# Patient Record
Sex: Male | Born: 1940 | Race: White | Hispanic: No | Marital: Married | Smoking: Never smoker
Health system: Southern US, Community
[De-identification: ages and names within clinical notes are randomized; demographics above are authoritative.]

## PROBLEM LIST (undated history)

## (undated) DIAGNOSIS — I639 Cerebral infarction, unspecified: Secondary | ICD-10-CM

## (undated) DIAGNOSIS — R51 Headache: Secondary | ICD-10-CM

## (undated) DIAGNOSIS — Z9889 Other specified postprocedural states: Secondary | ICD-10-CM

## (undated) DIAGNOSIS — Z87442 Personal history of urinary calculi: Secondary | ICD-10-CM

## (undated) DIAGNOSIS — R112 Nausea with vomiting, unspecified: Secondary | ICD-10-CM

## (undated) DIAGNOSIS — C44319 Basal cell carcinoma of skin of other parts of face: Secondary | ICD-10-CM

## (undated) DIAGNOSIS — I10 Essential (primary) hypertension: Secondary | ICD-10-CM

## (undated) DIAGNOSIS — G459 Transient cerebral ischemic attack, unspecified: Secondary | ICD-10-CM

## (undated) DIAGNOSIS — N400 Enlarged prostate without lower urinary tract symptoms: Secondary | ICD-10-CM

## (undated) DIAGNOSIS — K5730 Diverticulosis of large intestine without perforation or abscess without bleeding: Secondary | ICD-10-CM

## (undated) DIAGNOSIS — M1990 Unspecified osteoarthritis, unspecified site: Secondary | ICD-10-CM

## (undated) DIAGNOSIS — R519 Headache, unspecified: Secondary | ICD-10-CM

## (undated) DIAGNOSIS — N189 Chronic kidney disease, unspecified: Secondary | ICD-10-CM

## (undated) DIAGNOSIS — K4090 Unilateral inguinal hernia, without obstruction or gangrene, not specified as recurrent: Secondary | ICD-10-CM

## (undated) DIAGNOSIS — R200 Anesthesia of skin: Secondary | ICD-10-CM

## (undated) DIAGNOSIS — F419 Anxiety disorder, unspecified: Secondary | ICD-10-CM

## (undated) DIAGNOSIS — G8929 Other chronic pain: Secondary | ICD-10-CM

## (undated) DIAGNOSIS — N2889 Other specified disorders of kidney and ureter: Secondary | ICD-10-CM

## (undated) DIAGNOSIS — M25519 Pain in unspecified shoulder: Secondary | ICD-10-CM

## (undated) DIAGNOSIS — K219 Gastro-esophageal reflux disease without esophagitis: Secondary | ICD-10-CM

## (undated) DIAGNOSIS — R202 Paresthesia of skin: Secondary | ICD-10-CM

## (undated) HISTORY — PX: TONSILLECTOMY: SUR1361

## (undated) HISTORY — PX: HERNIA REPAIR: SHX51

## (undated) HISTORY — PX: EYE SURGERY: SHX253

## (undated) HISTORY — PX: JOINT REPLACEMENT: SHX530

---

## 1978-09-29 HISTORY — PX: APPENDECTOMY: SHX54

## 1999-09-30 HISTORY — PX: OTHER SURGICAL HISTORY: SHX169

## 2012-09-29 HISTORY — PX: JOINT REPLACEMENT: SHX530

## 2015-09-01 ENCOUNTER — Emergency Department (HOSPITAL_COMMUNITY): Payer: Medicare Other

## 2015-09-01 ENCOUNTER — Emergency Department (HOSPITAL_COMMUNITY)
Admission: EM | Admit: 2015-09-01 | Discharge: 2015-09-01 | Disposition: A | Discharge status: Home / Self Care | Payer: Medicare Other | Attending: Emergency Medicine | Admitting: Emergency Medicine

## 2015-09-01 ENCOUNTER — Encounter (HOSPITAL_COMMUNITY): Payer: Self-pay | Admitting: Emergency Medicine

## 2015-09-01 DIAGNOSIS — Z9889 Other specified postprocedural states: Secondary | ICD-10-CM | POA: Diagnosis not present

## 2015-09-01 DIAGNOSIS — I10 Essential (primary) hypertension: Secondary | ICD-10-CM | POA: Insufficient documentation

## 2015-09-01 DIAGNOSIS — M25562 Pain in left knee: Secondary | ICD-10-CM | POA: Diagnosis not present

## 2015-09-01 HISTORY — DX: Unspecified osteoarthritis, unspecified site: M19.90

## 2015-09-01 HISTORY — DX: Essential (primary) hypertension: I10

## 2015-09-01 MED ORDER — HYDROCODONE-ACETAMINOPHEN 5-325 MG PO TABS: 1.0000 | ORAL_TABLET | Freq: Four times a day (QID) | ORAL | Status: DC | PRN

## 2015-09-01 NOTE — ED Provider Notes (Addendum)
CSN: [id number]     Arrival date & time 09/01/15  1045 History   First MD Initiated Contact with Patient 09/01/15 1246     Chief Complaint  Patient presents with  . Knee Pain     (Consider location/radiation/quality/duration/timing/severity/associated sxs/prior Treatment) HPI Comments: Patient is a 74 year old male with history of prior knee surgery. He presents for evaluation of left knee pain. He states that he was sleeping last night and moved his knee awkwardly. Since that time he is experiencing severe pain. He does report being on his feet more raking leaves and doing yardwork, but denies any specific injury.  Patient is a 74 y.o. male presenting with knee pain. The history is provided by the patient.  Knee Pain Location:  Knee Time since incident:  12 hours Knee location:  L knee Pain details:    Quality:  Sharp   Radiates to:  Does not radiate   Severity:  Severe   Onset quality:  Sudden   Duration:  12 hours   Timing:  Constant   Progression:  Unchanged Chronicity:  New Relieved by:  Nothing Worsened by:  Extension and bearing weight Ineffective treatments:  None tried   Past Medical History  Diagnosis Date  . Hypertension   . Arthritis    Past Surgical History  Procedure Laterality Date  . Joint replacement     No family history on file. Social History  Substance Use Topics  . Smoking status: Never Smoker   . Smokeless tobacco: None  . Alcohol Use: No    Review of Systems  All other systems reviewed and are negative.     Allergies  Review of patient's allergies indicates not on file.  Home Medications   Prior to Admission medications   Not on File   BP 115/71 mmHg  Pulse 67  Temp(Src) 97.9 F (36.6 C) (Oral)  Resp 16  Ht 5\' 9"  (1.753 m)  Wt 200 lb (90.719 kg)  BMI 29.52 kg/m2  SpO2 99% Physical Exam  Constitutional: He is oriented to person, place, and time. He appears well-developed and well-nourished. No distress.  HENT:  Head:  Normocephalic and atraumatic.  Neck: Normal range of motion. Neck supple.  Musculoskeletal: Normal range of motion.  The left knee appears grossly normal. The prior surgery incisions are noted and are well-healed. There is tenderness over the anterior aspect of the knee just below the patella and in the posterior aspect of the knee. There is pain with range of motion. Exam is limited due to the degree of discomfort.  Neurological: He is alert and oriented to person, place, and time.  Skin: Skin is warm and dry. He is not diaphoretic.  Nursing note and vitals reviewed.   ED Course  Procedures (including critical care time) Labs Review Labs Reviewed - No data to display  Imaging Review No results found. I have personally reviewed and evaluated these images and lab results as part of my medical decision-making.   EKG Interpretation None      MDM   Final diagnoses:  None    X-rays reveal degenerative changes, however no other acute pathology. He has pain with range of motion of the knee, however there is no effusion, warmth, or redness. I highly doubt a septic joint. There also appears to be no other acute pathology. He will be treated with pain medication, and knee immobilizer, crutches, and follow-up with orthopedics if not improving.    Veryl Speak, MD 09/01/15 [place],  MD 09/01/15 1500

## 2015-09-01 NOTE — Discharge Instructions (Signed)
Where knee immobilizer as applied.  Use crutches for ambulation and weightbearing as tolerated.  Hydrocodone as prescribed as needed for pain.  Follow-up with orthopedics if not improving in the next several days. The contact information for Dr. Marlou Sa has been provided in this discharge summary for you to arrange a follow-up appointment.   Knee Pain Knee pain is a very common symptom and can have many causes. Knee pain often goes away when you follow your health care provider's instructions for relieving pain and discomfort at home. However, knee pain can develop into a condition that needs treatment. Some conditions may include:  Arthritis caused by wear and tear (osteoarthritis).  Arthritis caused by swelling and irritation (rheumatoid arthritis or gout).  A cyst or growth in your knee.  An infection in your knee joint.  An injury that will not heal.  Damage, swelling, or irritation of the tissues that support your knee (torn ligaments or tendinitis). If your knee pain continues, additional tests may be ordered to diagnose your condition. Tests may include X-rays or other imaging studies of your knee. You may also need to have fluid removed from your knee. Treatment for ongoing knee pain depends on the cause, but treatment may include:  Medicines to relieve pain or swelling.  Steroid injections in your knee.  Physical therapy.  Surgery. HOME CARE INSTRUCTIONS  Take medicines only as directed by your health care provider.  Rest your knee and keep it raised (elevated) while you are resting.  Do not do things that cause or worsen pain.  Avoid high-impact activities or exercises, such as running, jumping rope, or doing jumping jacks.  Apply ice to the knee area:  Put ice in a plastic bag.  Place a towel between your skin and the bag.  Leave the ice on for 20 minutes, 2-3 times a day.  Ask your health care provider if you should wear an elastic knee support.  Keep a  pillow under your knee when you sleep.  Lose weight if you are overweight. Extra weight can put pressure on your knee.  Do not use any tobacco products, including cigarettes, chewing tobacco, or electronic cigarettes. If you need help quitting, ask your health care provider. Smoking may slow the healing of any bone and joint problems that you may have. SEEK MEDICAL CARE IF:  Your knee pain continues, changes, or gets worse.  You have a fever along with knee pain.  Your knee buckles or locks up.  Your knee becomes more swollen. SEEK IMMEDIATE MEDICAL CARE IF:   Your knee joint feels hot to the touch.  You have chest pain or trouble breathing.   This information is not intended to replace advice given to you by your health care provider. Make sure you discuss any questions you have with your health care provider.   Document Released: 07/13/2007 Document Revised: 10/06/2014 Document Reviewed: 05/01/2014 Elsevier Interactive Patient Education [employer].

## 2015-09-01 NOTE — ED Notes (Signed)
Ptl. Stated, I tried to get out of bed this morning and my left knee just would not work.  I've had knee surgery and a left hip replacement.  i usually have arthritis but able to walk.

## 2015-12-10 DIAGNOSIS — M25511 Pain in right shoulder: Secondary | ICD-10-CM | POA: Diagnosis not present

## 2015-12-10 DIAGNOSIS — R600 Localized edema: Secondary | ICD-10-CM | POA: Diagnosis not present

## 2015-12-10 DIAGNOSIS — Z974 Presence of external hearing-aid: Secondary | ICD-10-CM | POA: Diagnosis not present

## 2015-12-10 DIAGNOSIS — E7800 Pure hypercholesterolemia, unspecified: Secondary | ICD-10-CM | POA: Diagnosis not present

## 2015-12-10 DIAGNOSIS — R112 Nausea with vomiting, unspecified: Secondary | ICD-10-CM | POA: Diagnosis not present

## 2015-12-10 DIAGNOSIS — M25512 Pain in left shoulder: Secondary | ICD-10-CM | POA: Diagnosis not present

## 2015-12-10 DIAGNOSIS — M170 Bilateral primary osteoarthritis of knee: Secondary | ICD-10-CM | POA: Diagnosis not present

## 2015-12-10 DIAGNOSIS — I10 Essential (primary) hypertension: Secondary | ICD-10-CM | POA: Diagnosis not present

## 2015-12-10 DIAGNOSIS — Z96642 Presence of left artificial hip joint: Secondary | ICD-10-CM | POA: Diagnosis not present

## 2015-12-14 DIAGNOSIS — R600 Localized edema: Secondary | ICD-10-CM | POA: Diagnosis not present

## 2015-12-14 DIAGNOSIS — E7800 Pure hypercholesterolemia, unspecified: Secondary | ICD-10-CM | POA: Diagnosis not present

## 2015-12-14 DIAGNOSIS — M25511 Pain in right shoulder: Secondary | ICD-10-CM | POA: Diagnosis not present

## 2015-12-14 DIAGNOSIS — Z974 Presence of external hearing-aid: Secondary | ICD-10-CM | POA: Diagnosis not present

## 2015-12-14 DIAGNOSIS — Z96642 Presence of left artificial hip joint: Secondary | ICD-10-CM | POA: Diagnosis not present

## 2015-12-14 DIAGNOSIS — M170 Bilateral primary osteoarthritis of knee: Secondary | ICD-10-CM | POA: Diagnosis not present

## 2015-12-14 DIAGNOSIS — M25512 Pain in left shoulder: Secondary | ICD-10-CM | POA: Diagnosis not present

## 2015-12-14 DIAGNOSIS — I10 Essential (primary) hypertension: Secondary | ICD-10-CM | POA: Diagnosis not present

## 2016-02-11 DIAGNOSIS — L404 Guttate psoriasis: Secondary | ICD-10-CM | POA: Diagnosis not present

## 2016-02-11 DIAGNOSIS — M25512 Pain in left shoulder: Secondary | ICD-10-CM | POA: Diagnosis not present

## 2016-02-11 DIAGNOSIS — Z974 Presence of external hearing-aid: Secondary | ICD-10-CM | POA: Diagnosis not present

## 2016-02-11 DIAGNOSIS — I10 Essential (primary) hypertension: Secondary | ICD-10-CM | POA: Diagnosis not present

## 2016-02-11 DIAGNOSIS — E7800 Pure hypercholesterolemia, unspecified: Secondary | ICD-10-CM | POA: Diagnosis not present

## 2016-02-11 DIAGNOSIS — M25511 Pain in right shoulder: Secondary | ICD-10-CM | POA: Diagnosis not present

## 2016-02-11 DIAGNOSIS — L821 Other seborrheic keratosis: Secondary | ICD-10-CM | POA: Diagnosis not present

## 2016-02-11 DIAGNOSIS — M170 Bilateral primary osteoarthritis of knee: Secondary | ICD-10-CM | POA: Diagnosis not present

## 2016-06-16 DIAGNOSIS — L400 Psoriasis vulgaris: Secondary | ICD-10-CM | POA: Diagnosis not present

## 2016-06-16 DIAGNOSIS — D225 Melanocytic nevi of trunk: Secondary | ICD-10-CM | POA: Diagnosis not present

## 2016-06-16 DIAGNOSIS — L820 Inflamed seborrheic keratosis: Secondary | ICD-10-CM | POA: Diagnosis not present

## 2016-06-16 DIAGNOSIS — L814 Other melanin hyperpigmentation: Secondary | ICD-10-CM | POA: Diagnosis not present

## 2016-06-16 DIAGNOSIS — D1721 Benign lipomatous neoplasm of skin and subcutaneous tissue of right arm: Secondary | ICD-10-CM | POA: Diagnosis not present

## 2016-06-16 DIAGNOSIS — L821 Other seborrheic keratosis: Secondary | ICD-10-CM | POA: Diagnosis not present

## 2016-06-16 DIAGNOSIS — D1801 Hemangioma of skin and subcutaneous tissue: Secondary | ICD-10-CM | POA: Diagnosis not present

## 2016-07-03 DIAGNOSIS — E7800 Pure hypercholesterolemia, unspecified: Secondary | ICD-10-CM | POA: Diagnosis not present

## 2016-07-03 DIAGNOSIS — L404 Guttate psoriasis: Secondary | ICD-10-CM | POA: Diagnosis not present

## 2016-07-03 DIAGNOSIS — J45909 Unspecified asthma, uncomplicated: Secondary | ICD-10-CM | POA: Diagnosis not present

## 2016-07-03 DIAGNOSIS — I10 Essential (primary) hypertension: Secondary | ICD-10-CM | POA: Diagnosis not present

## 2016-07-03 DIAGNOSIS — G8929 Other chronic pain: Secondary | ICD-10-CM | POA: Diagnosis not present

## 2016-07-03 DIAGNOSIS — Z23 Encounter for immunization: Secondary | ICD-10-CM | POA: Diagnosis not present

## 2016-07-08 DIAGNOSIS — R05 Cough: Secondary | ICD-10-CM | POA: Diagnosis not present

## 2016-10-03 DIAGNOSIS — R05 Cough: Secondary | ICD-10-CM | POA: Diagnosis not present

## 2016-11-04 DIAGNOSIS — E7800 Pure hypercholesterolemia, unspecified: Secondary | ICD-10-CM | POA: Diagnosis not present

## 2016-11-04 DIAGNOSIS — N529 Male erectile dysfunction, unspecified: Secondary | ICD-10-CM | POA: Diagnosis not present

## 2016-11-04 DIAGNOSIS — F5101 Primary insomnia: Secondary | ICD-10-CM | POA: Diagnosis not present

## 2016-11-04 DIAGNOSIS — J45909 Unspecified asthma, uncomplicated: Secondary | ICD-10-CM | POA: Diagnosis not present

## 2016-11-04 DIAGNOSIS — L404 Guttate psoriasis: Secondary | ICD-10-CM | POA: Diagnosis not present

## 2016-11-04 DIAGNOSIS — G8929 Other chronic pain: Secondary | ICD-10-CM | POA: Diagnosis not present

## 2016-11-04 DIAGNOSIS — I10 Essential (primary) hypertension: Secondary | ICD-10-CM | POA: Diagnosis not present

## 2017-05-12 DIAGNOSIS — F5101 Primary insomnia: Secondary | ICD-10-CM | POA: Diagnosis not present

## 2017-05-12 DIAGNOSIS — I10 Essential (primary) hypertension: Secondary | ICD-10-CM | POA: Diagnosis not present

## 2017-05-12 DIAGNOSIS — R1013 Epigastric pain: Secondary | ICD-10-CM | POA: Diagnosis not present

## 2017-05-12 DIAGNOSIS — L404 Guttate psoriasis: Secondary | ICD-10-CM | POA: Diagnosis not present

## 2017-05-12 DIAGNOSIS — N529 Male erectile dysfunction, unspecified: Secondary | ICD-10-CM | POA: Diagnosis not present

## 2017-05-12 DIAGNOSIS — J45909 Unspecified asthma, uncomplicated: Secondary | ICD-10-CM | POA: Diagnosis not present

## 2017-05-12 DIAGNOSIS — E7800 Pure hypercholesterolemia, unspecified: Secondary | ICD-10-CM | POA: Diagnosis not present

## 2017-05-12 DIAGNOSIS — G8929 Other chronic pain: Secondary | ICD-10-CM | POA: Diagnosis not present

## 2017-05-13 DIAGNOSIS — K219 Gastro-esophageal reflux disease without esophagitis: Secondary | ICD-10-CM | POA: Diagnosis not present

## 2017-05-13 DIAGNOSIS — R1013 Epigastric pain: Secondary | ICD-10-CM | POA: Diagnosis not present

## 2017-05-13 DIAGNOSIS — R634 Abnormal weight loss: Secondary | ICD-10-CM | POA: Diagnosis not present

## 2017-05-30 DIAGNOSIS — K5730 Diverticulosis of large intestine without perforation or abscess without bleeding: Secondary | ICD-10-CM

## 2017-05-30 DIAGNOSIS — K4090 Unilateral inguinal hernia, without obstruction or gangrene, not specified as recurrent: Secondary | ICD-10-CM

## 2017-05-30 HISTORY — DX: Diverticulosis of large intestine without perforation or abscess without bleeding: K57.30

## 2017-05-30 HISTORY — DX: Unilateral inguinal hernia, without obstruction or gangrene, not specified as recurrent: K40.90

## 2017-06-16 ENCOUNTER — Emergency Department (HOSPITAL_COMMUNITY): Payer: PPO

## 2017-06-16 ENCOUNTER — Emergency Department (HOSPITAL_COMMUNITY)
Admission: EM | Admit: 2017-06-16 | Discharge: 2017-06-16 | Disposition: A | Discharge status: Home / Self Care | Payer: PPO | Attending: Emergency Medicine | Admitting: Emergency Medicine

## 2017-06-16 ENCOUNTER — Encounter (HOSPITAL_COMMUNITY): Payer: Self-pay | Admitting: *Deleted

## 2017-06-16 DIAGNOSIS — I6501 Occlusion and stenosis of right vertebral artery: Secondary | ICD-10-CM | POA: Diagnosis not present

## 2017-06-16 DIAGNOSIS — R42 Dizziness and giddiness: Secondary | ICD-10-CM | POA: Diagnosis not present

## 2017-06-16 DIAGNOSIS — I10 Essential (primary) hypertension: Secondary | ICD-10-CM | POA: Insufficient documentation

## 2017-06-16 DIAGNOSIS — H532 Diplopia: Secondary | ICD-10-CM | POA: Insufficient documentation

## 2017-06-16 DIAGNOSIS — R110 Nausea: Secondary | ICD-10-CM | POA: Insufficient documentation

## 2017-06-16 DIAGNOSIS — J018 Other acute sinusitis: Secondary | ICD-10-CM | POA: Insufficient documentation

## 2017-06-16 DIAGNOSIS — Z79899 Other long term (current) drug therapy: Secondary | ICD-10-CM | POA: Diagnosis not present

## 2017-06-16 DIAGNOSIS — R51 Headache: Secondary | ICD-10-CM | POA: Diagnosis not present

## 2017-06-16 LAB — CBC
HEMATOCRIT: 41.2 % (ref 39.0–52.0)
Hemoglobin: 13.7 g/dL (ref 13.0–17.0)
MCH: 29.5 pg (ref 26.0–34.0)
MCHC: 33.3 g/dL (ref 30.0–36.0)
MCV: 88.6 fL (ref 78.0–100.0)
PLATELETS: 246 10*3/uL (ref 150–400)
RBC: 4.65 MIL/uL (ref 4.22–5.81)
RDW: 13 % (ref 11.5–15.5)
WBC: 6.7 10*3/uL (ref 4.0–10.5)

## 2017-06-16 LAB — COMPREHENSIVE METABOLIC PANEL
ALK PHOS: 44 U/L (ref 38–126)
ALT: 20 U/L (ref 17–63)
AST: 23 U/L (ref 15–41)
Albumin: 4.2 g/dL (ref 3.5–5.0)
Anion gap: 11 (ref 5–15)
BUN: 18 mg/dL (ref 6–20)
CALCIUM: 9.6 mg/dL (ref 8.9–10.3)
CO2: 27 mmol/L (ref 22–32)
CREATININE: 1.31 mg/dL — AB (ref 0.61–1.24)
Chloride: 102 mmol/L (ref 101–111)
GFR calc non Af Amer: 51 mL/min — ABNORMAL LOW (ref >60)
GFR, EST AFRICAN AMERICAN: 59 mL/min — AB (ref >60)
GLUCOSE: 103 mg/dL — AB (ref 65–99)
Potassium: 3.8 mmol/L (ref 3.5–5.1)
SODIUM: 140 mmol/L (ref 135–145)
Total Bilirubin: 0.5 mg/dL (ref 0.3–1.2)
Total Protein: 7.3 g/dL (ref 6.5–8.1)

## 2017-06-16 LAB — DIFFERENTIAL
Basophils Absolute: 0 10*3/uL (ref 0.0–0.1)
Basophils Relative: 0 %
Eosinophils Absolute: 0.2 10*3/uL (ref 0.0–0.7)
Eosinophils Relative: 3 %
LYMPHS PCT: 32 %
Lymphs Abs: 2.1 10*3/uL (ref 0.7–4.0)
MONO ABS: 0.7 10*3/uL (ref 0.1–1.0)
Monocytes Relative: 11 %
NEUTROS ABS: 3.6 10*3/uL (ref 1.7–7.7)
Neutrophils Relative %: 54 %

## 2017-06-16 LAB — I-STAT CHEM 8, ED
BUN: 22 mg/dL — AB (ref 6–20)
CALCIUM ION: 1.13 mmol/L — AB (ref 1.15–1.40)
CREATININE: 1.2 mg/dL (ref 0.61–1.24)
Chloride: 102 mmol/L (ref 101–111)
GLUCOSE: 104 mg/dL — AB (ref 65–99)
HCT: 43 % (ref 39.0–52.0)
Hemoglobin: 14.6 g/dL (ref 13.0–17.0)
Potassium: 3.8 mmol/L (ref 3.5–5.1)
Sodium: 142 mmol/L (ref 135–145)
TCO2: 29 mmol/L (ref 22–32)

## 2017-06-16 LAB — PROTIME-INR
INR: 0.91
PROTHROMBIN TIME: 12.2 s (ref 11.4–15.2)

## 2017-06-16 LAB — APTT: aPTT: 31 seconds (ref 24–36)

## 2017-06-16 LAB — I-STAT TROPONIN, ED: Troponin i, poc: 0 ng/mL (ref 0.00–0.08)

## 2017-06-16 MED ORDER — METOCLOPRAMIDE HCL 5 MG/ML IJ SOLN
10.0000 mg | Freq: Once | INTRAMUSCULAR | Status: AC
  Administered 2017-06-16: 10 mg via INTRAVENOUS
  Filled 2017-06-16: qty 2

## 2017-06-16 MED ORDER — LORAZEPAM 2 MG/ML IJ SOLN
1.0000 mg | Freq: Once | INTRAMUSCULAR | Status: AC | PRN
  Administered 2017-06-16: 1 mg via INTRAVENOUS
  Filled 2017-06-16: qty 1

## 2017-06-16 NOTE — ED Notes (Signed)
Patient transported to MRI 

## 2017-06-16 NOTE — ED Provider Notes (Signed)
[facility] DEPT Provider Note   CSN: [id number] Arrival date & time: 06/16/17  1640     History   Chief Complaint Chief Complaint  Patient presents with  . Headache  . Stroke Symptoms    HPI Bernard Young is a 76 y.o. male.  HPI 76 year old male who presents with headache and double vision. He has a history of hypertension. 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 Reports headache for the past 13 days that has been constant, intermittent in severity over the frontal aspect of his forehead. Today at about 9 AM to 10 AM he developed double vision. He has felt nauseated but denies any vomiting. Has not had speech changes, focal numbness or weakness. He has noted some difficulty walking, but no dizziness.Has not tried any medications for his symptoms. No other alleviating or aggravating factors.   Past Medical History:  Diagnosis Date  . Arthritis   . Hypertension     There are no active problems to display for this patient.   Past Surgical History:  Procedure Laterality Date  . JOINT REPLACEMENT         Home Medications    Prior to Admission medications   Medication Sig Start Date End Date Taking? Authorizing Provider  hydrochlorothiazide (HYDRODIURIL) 25 MG tablet Take 25 mg by mouth daily. 05/12/17  Yes [provider]  HYDROcodone-acetaminophen (NORCO) 5-325 MG tablet Take 1-2 tablets by mouth every 6 (six) hours as needed. 09/01/15  Yes Delo, Nathaneil Canary, MD  Multiple Vitamin (MULTI-VITAMIN DAILY PO) Take 1 tablet by mouth daily. Men 50 + Multi Vitamin   Yes [provider]  omeprazole (PRILOSEC) 40 MG capsule Take 40 mg by mouth daily. 06/15/17  Yes [provider]  triamcinolone cream (KENALOG) 0.1 % Apply 1 application topically daily. 05/12/17  Yes [provider]    Family History No family history on file.  Social  History Social History  Substance Use Topics  . Smoking status: Never Smoker  . Smokeless tobacco: Not on file  . Alcohol use No     Allergies   Sulfa antibiotics and Penicillins   Review of Systems Review of Systems  Constitutional: Negative for fever.  Respiratory: Negative for shortness of breath.   Cardiovascular: Negative for chest pain.  Gastrointestinal: Positive for nausea.  Neurological: Positive for headaches.  All other systems reviewed and are negative.    Physical Exam Updated Vital Signs BP 120/73   Pulse 80   Temp 97.8 F (36.6 C) (Oral)   Resp 16   SpO2 96%   Physical Exam Physical Exam  Nursing note and vitals reviewed. Constitutional: Well developed, well nourished, non-toxic, and in no acute distress Head: Normocephalic and atraumatic.  Mouth/Throat: Oropharynx is clear and moist.  Neck: Normal range of motion. Neck supple.  Cardiovascular: Normal rate and regular rhythm.   Pulmonary/Chest: Effort normal and breath sounds normal.  Abdominal: Soft. There is no tenderness. There is no rebound and no guarding.  Musculoskeletal: Normal range of motion.  Skin: Skin is warm and dry.  Psychiatric: Cooperative Neurological:  Alert, oriented to person, place, time, and situation. Memory grossly in tact. Fluent speech. No dysarthria or aphasia.  Cranial nerves: VF are full.  EOMI without nystagmus. No gaze deviation. Facial muscles symmetric with activation. Sensation to light touch over face in tact bilaterally. Hearing grossly in tact. Palate elevates symmetrically. Head turn  and shoulder shrug are intact. Tongue midline.  Reflexes defered.  Muscle bulk and tone normal. No pronator drift. Moves all extremities symmetrically. Sensation to light touch is in tact throughout in bilateral upper and lower extremities. Coordination reveals no dysmetria with finger to nose.    ED Treatments / Results  Labs (all labs ordered are listed, but only abnormal  results are displayed) Labs Reviewed  COMPREHENSIVE METABOLIC PANEL - Abnormal; Notable for the following:       Result Value   Glucose, Bld 103 (*)    Creatinine, Ser 1.31 (*)    GFR calc non Af Amer 51 (*)    GFR calc Af Amer 59 (*)    All other components within normal limits  I-STAT CHEM 8, ED - Abnormal; Notable for the following:    BUN 22 (*)    Glucose, Bld 104 (*)    Calcium, Ion 1.13 (*)    All other components within normal limits  PROTIME-INR  APTT  CBC  DIFFERENTIAL  I-STAT TROPONIN, ED  CBG MONITORING, ED    EKG  EKG Interpretation  Date/Time:  Tuesday June 16 2017 16:42:44 EDT Ventricular Rate:  87 PR Interval:  220 QRS Duration: 82 QT Interval:  372 QTC Calculation: 447 R Axis:   58 Text Interpretation:  Sinus rhythm with 1st degree A-V block Otherwise normal ECG no previous EKG  Confirmed by Brantley Stage [phone]) on 06/16/2017 5:38:18 PM       Radiology Ct Head Wo Contrast  Result Date: 06/16/2017 CLINICAL DATA:  Pt c/o severe HA ongoing for 2 weeks. Sts " I thought it was due to the medicine I was taking for stomach upset " . Sts increased severity with dizziness today. H/O HTN EXAM: CT HEAD WITHOUT CONTRAST TECHNIQUE: Contiguous axial images were obtained from the base of the skull through the vertex without intravenous contrast. COMPARISON:  None. FINDINGS: Brain: Ventricles are within normal limits in size and configuration. Minimal chronic small vessel ischemic change noted within the deep periventricular white matter. There is no mass, hemorrhage, edema or other evidence of acute parenchymal abnormality. No extra-axial hemorrhage. Vascular: There are chronic calcified atherosclerotic changes of the large vessels at the skull base. No unexpected hyperdense vessel. Skull: Normal. Negative for fracture or focal lesion. Sinuses/Orbits: Chronic mucous retention cysts within the frontal, maxillary and sphenoid sinuses. Additional mild mucosal thickening within  the ethmoid air cells which is of uncertain age. Periorbital and retro-orbital soft tissues are unremarkable. Other: None. IMPRESSION: 1. No acute intracranial abnormality. No intracranial mass, hemorrhage or edema. 2. Paranasal sinus disease, of indeterminate chronicity within the ethmoid air cells. Electronically Signed   By: Franki Cabot M.D.   On: 06/16/2017 17:57   Mr Brain Wo Contrast  Result Date: 06/16/2017 CLINICAL DATA:  Initial evaluation for acute headache with double vision. History of hypertension. EXAM: MRI HEAD WITHOUT CONTRAST TECHNIQUE: Multiplanar, multiecho pulse sequences of the brain and surrounding structures were obtained without intravenous contrast. COMPARISON:  Prior CT from earlier same day. FINDINGS: Brain: Cerebral volume within normal limits for age. Mild chronic microvascular ischemic changes within the periventricular and deep white matter both cerebral hemispheres. No abnormal foci of restricted diffusion to suggest acute or subacute ischemia. No areas of remote cortical infarction identified. No susceptibility artifact to suggest acute or chronic intracranial hemorrhage. No mass lesion, midline shift or mass effect. No hydrocephalus. No extra-axial fluid collection. Major dural sinuses grossly patent. Pituitary gland suprasellar region normal. Midline structures intact  and normal. Vascular: Major intracranial vascular flow voids maintained. Right vertebral artery hypoplastic. Skull and upper cervical spine: Craniocervical junction normal. Upper cervical spine within normal limits. Bone marrow signal intensity normal. No scalp soft tissue abnormality. Sinuses/Orbits: Globes orbital soft tissues within normal limits. Scattered mucosal thickening within the ethmoidal air cells, sphenoid sinuses, and maxillary sinuses. Superimposed air-fluid level within right maxillary sinus, suggesting acute sinusitis. No mastoid effusion. Inner ear structures normal. Other: None. IMPRESSION: 1.  No acute intracranial infarct or other process identified. 2. 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 3. Acute paranasal sinusitis as above. Electronically Signed   By: Jeannine Boga M.D.   On: 06/16/2017 21:35    Procedures Procedures (including critical care time)  Medications Ordered in ED Medications  metoCLOPramide (REGLAN) injection 10 mg (10 mg Intravenous Given 06/16/17 1937)  LORazepam (ATIVAN) injection 1 mg (1 mg Intravenous Given 06/16/17 1940)     Initial Impression / Assessment and Plan / ED Course  I have reviewed the triage vital signs and the nursing notes.  Pertinent labs & imaging results that were available during my care of the patient were reviewed by me and considered in my medical decision making (see chart for details).     Presenting with headache and double vision. Arrives near 8 hours after onset of symptoms, this code stroke was deferred as he is not a TPA candidate not meeting any large vessel occlusion criteria. The rest of his neurological exam is intact. Initial CT does not show acute intercranial processes. Discussed with Dr. Rory Percy from neurology, who recommended obtaining an MRI. MRI does not show acute intracranial processes such as stroke or other processes. He does have evidence of sinusitis. Discussed with Dr. Nicole Kindred from neurology, who recommended outpatient workup with ophthalmology. Strict return and follow-up instructions reviewed. He expressed understanding of all discharge instructions and felt comfortable with the plan of care.   Final Clinical Impressions(s) / ED Diagnoses   Final diagnoses:  Diplopia  Other acute sinusitis, recurrence not specified    New Prescriptions New Prescriptions   No medications on file     Forde Dandy, MD 06/16/17 2211

## 2017-06-16 NOTE — ED Triage Notes (Signed)
To ED for eval of double vision since lunch time. Pt states he's had a HA for the past 13 days. Frontal. No vomiting. About lunch time today he noticed double vision. Went to [place] office and sent to ED for eval. Dr Oleta Mouse to triage for eval

## 2017-06-16 NOTE — ED Notes (Signed)
Helped pt to a bed.  Pt able to ambulate

## 2017-06-16 NOTE — Discharge Instructions (Signed)
Your MRI does not show acute problems with the brain. He had some sinusitis, but it does not explain your double vision. We recommend that you follow-up with an ophthalmologist for dedicated eye exam for further evaluation of the double vision.  Please return without fail for worsening symptoms, including fever, confusion, difficulty walking, new numbness or weakness, or any other symptoms concerning to you.

## 2017-06-19 ENCOUNTER — Other Ambulatory Visit (HOSPITAL_COMMUNITY)
Admission: RE | Admit: 2017-06-19 | Discharge: 2017-06-19 | Disposition: A | Discharge status: Home / Self Care | Payer: PPO | Source: Ambulatory Visit | Attending: Ophthalmology | Admitting: Ophthalmology

## 2017-06-19 DIAGNOSIS — R51 Headache: Secondary | ICD-10-CM | POA: Diagnosis not present

## 2017-06-19 DIAGNOSIS — H532 Diplopia: Secondary | ICD-10-CM | POA: Insufficient documentation

## 2017-06-19 DIAGNOSIS — I10 Essential (primary) hypertension: Secondary | ICD-10-CM | POA: Diagnosis not present

## 2017-06-19 DIAGNOSIS — H4922 Sixth [abducent] nerve palsy, left eye: Secondary | ICD-10-CM | POA: Diagnosis not present

## 2017-06-19 LAB — SEDIMENTATION RATE: SED RATE: 8 mm/h (ref 0–16)

## 2017-06-19 LAB — C-REACTIVE PROTEIN

## 2017-06-23 ENCOUNTER — Other Ambulatory Visit: Payer: Self-pay | Admitting: Gastroenterology

## 2017-06-23 DIAGNOSIS — K219 Gastro-esophageal reflux disease without esophagitis: Secondary | ICD-10-CM | POA: Diagnosis not present

## 2017-06-23 DIAGNOSIS — R110 Nausea: Secondary | ICD-10-CM | POA: Diagnosis not present

## 2017-06-23 DIAGNOSIS — R634 Abnormal weight loss: Secondary | ICD-10-CM | POA: Diagnosis not present

## 2017-06-23 DIAGNOSIS — R1013 Epigastric pain: Secondary | ICD-10-CM | POA: Diagnosis not present

## 2017-06-26 ENCOUNTER — Ambulatory Visit
Admission: RE | Admit: 2017-06-26 | Discharge: 2017-06-26 | Disposition: A | Discharge status: Home / Self Care | Payer: PPO | Source: Ambulatory Visit | Attending: Gastroenterology | Admitting: Gastroenterology

## 2017-06-26 DIAGNOSIS — R1013 Epigastric pain: Secondary | ICD-10-CM

## 2017-06-26 DIAGNOSIS — N400 Enlarged prostate without lower urinary tract symptoms: Secondary | ICD-10-CM

## 2017-06-26 DIAGNOSIS — K5730 Diverticulosis of large intestine without perforation or abscess without bleeding: Secondary | ICD-10-CM | POA: Diagnosis not present

## 2017-06-26 DIAGNOSIS — H4922 Sixth [abducent] nerve palsy, left eye: Secondary | ICD-10-CM | POA: Diagnosis not present

## 2017-06-26 HISTORY — DX: Benign prostatic hyperplasia without lower urinary tract symptoms: N40.0

## 2017-06-26 MED ORDER — IOPAMIDOL (ISOVUE-300) INJECTION 61%
100.0000 mL | Freq: Once | INTRAVENOUS | Status: AC | PRN
  Administered 2017-06-26: 100 mL via INTRAVENOUS

## 2017-06-29 ENCOUNTER — Other Ambulatory Visit: Payer: Self-pay | Admitting: Gastroenterology

## 2017-06-29 DIAGNOSIS — R9389 Abnormal findings on diagnostic imaging of other specified body structures: Secondary | ICD-10-CM

## 2017-07-02 DIAGNOSIS — H4922 Sixth [abducent] nerve palsy, left eye: Secondary | ICD-10-CM | POA: Diagnosis not present

## 2017-07-02 DIAGNOSIS — R51 Headache: Secondary | ICD-10-CM | POA: Diagnosis not present

## 2017-07-02 DIAGNOSIS — N2889 Other specified disorders of kidney and ureter: Secondary | ICD-10-CM | POA: Diagnosis not present

## 2017-07-02 DIAGNOSIS — Z8659 Personal history of other mental and behavioral disorders: Secondary | ICD-10-CM | POA: Diagnosis not present

## 2017-07-02 DIAGNOSIS — J0100 Acute maxillary sinusitis, unspecified: Secondary | ICD-10-CM | POA: Diagnosis not present

## 2017-07-10 DIAGNOSIS — H4922 Sixth [abducent] nerve palsy, left eye: Secondary | ICD-10-CM | POA: Diagnosis not present

## 2017-07-13 ENCOUNTER — Ambulatory Visit
Admission: RE | Admit: 2017-07-13 | Discharge: 2017-07-13 | Disposition: A | Discharge status: Home / Self Care | Payer: PPO | Source: Ambulatory Visit | Attending: Gastroenterology | Admitting: Gastroenterology

## 2017-07-13 DIAGNOSIS — K760 Fatty (change of) liver, not elsewhere classified: Secondary | ICD-10-CM | POA: Diagnosis not present

## 2017-07-13 DIAGNOSIS — R9389 Abnormal findings on diagnostic imaging of other specified body structures: Secondary | ICD-10-CM

## 2017-07-13 MED ORDER — GADOBENATE DIMEGLUMINE 529 MG/ML IV SOLN
17.0000 mL | Freq: Once | INTRAVENOUS | Status: AC | PRN
  Administered 2017-07-13: 17 mL via INTRAVENOUS

## 2017-07-28 DIAGNOSIS — H4922 Sixth [abducent] nerve palsy, left eye: Secondary | ICD-10-CM | POA: Diagnosis not present

## 2017-07-28 DIAGNOSIS — N2889 Other specified disorders of kidney and ureter: Secondary | ICD-10-CM | POA: Diagnosis not present

## 2017-07-28 DIAGNOSIS — Z8659 Personal history of other mental and behavioral disorders: Secondary | ICD-10-CM | POA: Diagnosis not present

## 2017-07-28 DIAGNOSIS — Z23 Encounter for immunization: Secondary | ICD-10-CM | POA: Diagnosis not present

## 2017-07-28 DIAGNOSIS — J0100 Acute maxillary sinusitis, unspecified: Secondary | ICD-10-CM | POA: Diagnosis not present

## 2017-07-28 DIAGNOSIS — R51 Headache: Secondary | ICD-10-CM | POA: Diagnosis not present

## 2017-07-28 DIAGNOSIS — D49512 Neoplasm of unspecified behavior of left kidney: Secondary | ICD-10-CM | POA: Diagnosis not present

## 2017-07-28 DIAGNOSIS — M19071 Primary osteoarthritis, right ankle and foot: Secondary | ICD-10-CM | POA: Diagnosis not present

## 2017-08-03 DIAGNOSIS — Z8659 Personal history of other mental and behavioral disorders: Secondary | ICD-10-CM | POA: Diagnosis not present

## 2017-08-03 DIAGNOSIS — I10 Essential (primary) hypertension: Secondary | ICD-10-CM | POA: Diagnosis not present

## 2017-08-03 DIAGNOSIS — N281 Cyst of kidney, acquired: Secondary | ICD-10-CM | POA: Diagnosis not present

## 2017-08-03 DIAGNOSIS — M109 Gout, unspecified: Secondary | ICD-10-CM | POA: Diagnosis not present

## 2017-08-03 DIAGNOSIS — H4922 Sixth [abducent] nerve palsy, left eye: Secondary | ICD-10-CM | POA: Diagnosis not present

## 2017-08-14 ENCOUNTER — Ambulatory Visit (HOSPITAL_COMMUNITY)
Admission: RE | Admit: 2017-08-14 | Discharge: 2017-08-14 | Disposition: A | Discharge status: Home / Self Care | Payer: PPO | Source: Ambulatory Visit | Attending: Urology | Admitting: Urology

## 2017-08-14 ENCOUNTER — Other Ambulatory Visit (HOSPITAL_COMMUNITY): Payer: Self-pay | Admitting: Urology

## 2017-08-14 DIAGNOSIS — D49512 Neoplasm of unspecified behavior of left kidney: Secondary | ICD-10-CM

## 2017-08-14 DIAGNOSIS — R918 Other nonspecific abnormal finding of lung field: Secondary | ICD-10-CM | POA: Diagnosis not present

## 2017-08-17 DIAGNOSIS — H4922 Sixth [abducent] nerve palsy, left eye: Secondary | ICD-10-CM | POA: Diagnosis not present

## 2017-08-24 DIAGNOSIS — K219 Gastro-esophageal reflux disease without esophagitis: Secondary | ICD-10-CM | POA: Diagnosis not present

## 2017-08-24 DIAGNOSIS — N281 Cyst of kidney, acquired: Secondary | ICD-10-CM | POA: Diagnosis not present

## 2017-08-24 DIAGNOSIS — Z1211 Encounter for screening for malignant neoplasm of colon: Secondary | ICD-10-CM | POA: Diagnosis not present

## 2017-08-31 DIAGNOSIS — G8929 Other chronic pain: Secondary | ICD-10-CM | POA: Diagnosis not present

## 2017-08-31 DIAGNOSIS — M109 Gout, unspecified: Secondary | ICD-10-CM | POA: Diagnosis not present

## 2017-08-31 DIAGNOSIS — I10 Essential (primary) hypertension: Secondary | ICD-10-CM | POA: Diagnosis not present

## 2017-08-31 DIAGNOSIS — Z8659 Personal history of other mental and behavioral disorders: Secondary | ICD-10-CM | POA: Diagnosis not present

## 2017-08-31 DIAGNOSIS — H4922 Sixth [abducent] nerve palsy, left eye: Secondary | ICD-10-CM | POA: Diagnosis not present

## 2017-08-31 DIAGNOSIS — N281 Cyst of kidney, acquired: Secondary | ICD-10-CM | POA: Diagnosis not present

## 2017-08-31 DIAGNOSIS — Z79899 Other long term (current) drug therapy: Secondary | ICD-10-CM | POA: Diagnosis not present

## 2017-09-15 ENCOUNTER — Other Ambulatory Visit: Payer: Self-pay | Admitting: Urology

## 2017-09-18 DIAGNOSIS — H2513 Age-related nuclear cataract, bilateral: Secondary | ICD-10-CM | POA: Diagnosis not present

## 2017-09-18 DIAGNOSIS — G510 Bell's palsy: Secondary | ICD-10-CM | POA: Diagnosis not present

## 2017-09-25 ENCOUNTER — Encounter (HOSPITAL_COMMUNITY): Payer: Self-pay | Admitting: Emergency Medicine

## 2017-09-25 ENCOUNTER — Emergency Department (HOSPITAL_COMMUNITY): Payer: PPO

## 2017-09-25 ENCOUNTER — Emergency Department (HOSPITAL_COMMUNITY)
Admission: EM | Admit: 2017-09-25 | Discharge: 2017-09-25 | Disposition: A | Discharge status: Home / Self Care | Payer: PPO | Attending: Emergency Medicine | Admitting: Emergency Medicine

## 2017-09-25 ENCOUNTER — Other Ambulatory Visit: Payer: Self-pay

## 2017-09-25 DIAGNOSIS — M542 Cervicalgia: Secondary | ICD-10-CM | POA: Insufficient documentation

## 2017-09-25 DIAGNOSIS — R202 Paresthesia of skin: Secondary | ICD-10-CM | POA: Diagnosis not present

## 2017-09-25 DIAGNOSIS — Z8673 Personal history of transient ischemic attack (TIA), and cerebral infarction without residual deficits: Secondary | ICD-10-CM | POA: Diagnosis not present

## 2017-09-25 DIAGNOSIS — I10 Essential (primary) hypertension: Secondary | ICD-10-CM | POA: Insufficient documentation

## 2017-09-25 DIAGNOSIS — R200 Anesthesia of skin: Secondary | ICD-10-CM | POA: Diagnosis not present

## 2017-09-25 LAB — DIFFERENTIAL
BASOS ABS: 0 10*3/uL (ref 0.0–0.1)
BASOS PCT: 1 %
Eosinophils Absolute: 0.3 10*3/uL (ref 0.0–0.7)
Eosinophils Relative: 5 %
LYMPHS PCT: 31 %
Lymphs Abs: 1.5 10*3/uL (ref 0.7–4.0)
MONOS PCT: 8 %
Monocytes Absolute: 0.4 10*3/uL (ref 0.1–1.0)
NEUTROS ABS: 2.7 10*3/uL (ref 1.7–7.7)
Neutrophils Relative %: 55 %

## 2017-09-25 LAB — I-STAT CHEM 8, ED
BUN: 16 mg/dL (ref 6–20)
CHLORIDE: 105 mmol/L (ref 101–111)
CREATININE: 1 mg/dL (ref 0.61–1.24)
Calcium, Ion: 1.12 mmol/L — ABNORMAL LOW (ref 1.15–1.40)
GLUCOSE: 95 mg/dL (ref 65–99)
HCT: 39 % (ref 39.0–52.0)
Hemoglobin: 13.3 g/dL (ref 13.0–17.0)
POTASSIUM: 4.1 mmol/L (ref 3.5–5.1)
Sodium: 142 mmol/L (ref 135–145)
TCO2: 25 mmol/L (ref 22–32)

## 2017-09-25 LAB — COMPREHENSIVE METABOLIC PANEL
ALT: 19 U/L (ref 17–63)
AST: 21 U/L (ref 15–41)
Albumin: 3.7 g/dL (ref 3.5–5.0)
Alkaline Phosphatase: 48 U/L (ref 38–126)
Anion gap: 9 (ref 5–15)
BUN: 15 mg/dL (ref 6–20)
CHLORIDE: 105 mmol/L (ref 101–111)
CO2: 25 mmol/L (ref 22–32)
CREATININE: 1.04 mg/dL (ref 0.61–1.24)
Calcium: 9 mg/dL (ref 8.9–10.3)
GFR calc Af Amer: >60 mL/min (ref >60)
Glucose, Bld: 97 mg/dL (ref 65–99)
Potassium: 4.1 mmol/L (ref 3.5–5.1)
Sodium: 139 mmol/L (ref 135–145)
Total Bilirubin: 0.8 mg/dL (ref 0.3–1.2)
Total Protein: 6.1 g/dL — ABNORMAL LOW (ref 6.5–8.1)

## 2017-09-25 LAB — PROTIME-INR
INR: 0.94
PROTHROMBIN TIME: 12.5 s (ref 11.4–15.2)

## 2017-09-25 LAB — CBC
HEMATOCRIT: 39.6 % (ref 39.0–52.0)
Hemoglobin: 13.4 g/dL (ref 13.0–17.0)
MCH: 30.7 pg (ref 26.0–34.0)
MCHC: 33.8 g/dL (ref 30.0–36.0)
MCV: 90.6 fL (ref 78.0–100.0)
Platelets: 206 10*3/uL (ref 150–400)
RBC: 4.37 MIL/uL (ref 4.22–5.81)
RDW: 13.5 % (ref 11.5–15.5)
WBC: 4.7 10*3/uL (ref 4.0–10.5)

## 2017-09-25 LAB — APTT: aPTT: 29 seconds (ref 24–36)

## 2017-09-25 LAB — I-STAT TROPONIN, ED: TROPONIN I, POC: 0 ng/mL (ref 0.00–0.08)

## 2017-09-25 MED ORDER — GADOBENATE DIMEGLUMINE 529 MG/ML IV SOLN
20.0000 mL | Freq: Once | INTRAVENOUS | Status: AC | PRN
  Administered 2017-09-25: 20 mL via INTRAVENOUS

## 2017-09-25 MED ORDER — LORAZEPAM 2 MG/ML IJ SOLN
1.0000 mg | Freq: Once | INTRAMUSCULAR | Status: AC
  Administered 2017-09-25: 1 mg via INTRAVENOUS
  Filled 2017-09-25: qty 1

## 2017-09-25 NOTE — ED Notes (Signed)
Patient transported to MRI 

## 2017-09-25 NOTE — ED Provider Notes (Signed)
Emergency Department Provider Note   I have reviewed the triage vital signs and the nursing notes.   HISTORY  Chief Complaint Numbness   HPI Bernard Young is a 76 y.o. male reported history of arthritis, hypertension and TIAs who presents the emergency department today with left hand tingling.  Patient states that he was normal and went to bed last night this morning woke up he noticed that his left thumb first finger second finger were tingling.  No weakness there but this felt similar to previous episodes of TIAs.  Did not seem to extend anywhere else and had no other neurologic symptoms but because of his history presented here for further evaluation.  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  Patient on review of systems also noted that he had some neck pain that he noticed when this started but has since resolved..  He has chronic back pain, shoulder pain, hip pain and knee pain secondary to arthritis.  No nausea, vomiting or chest pain.  No other associated modifying symptoms.  Symptoms are persistent at this time.  Past Medical History:  Diagnosis Date  . Arthritis   . Hypertension     There are no active problems to display for this patient.   Past Surgical History:  Procedure Laterality Date  . JOINT REPLACEMENT      Current Outpatient Rx  . Order #: [id number] Class: Historical Med  . Order #: [id number] Class: Historical Med  . Order #: [id number] Class: Print  . Order #: [id number] Class: Historical Med  . Order #: [id number] Class: Historical Med  . Order #: [id number] Class: Historical Med  . Order #: [id number] Class: Historical Med  . Order #: [id number] Class: Historical Med    Allergies Sulfa antibiotics and Penicillins  No family history on file.  Social History Social History   Tobacco Use  . Smoking status: Never Smoker  Substance Use Topics  . Alcohol use: No  . Drug use: No      Review of Systems  All other systems negative except as documented in the HPI. All pertinent positives and negatives as reviewed in the HPI. ____________________________________________   PHYSICAL EXAM:  VITAL SIGNS: ED Triage Vitals  Enc Vitals Group     BP 09/25/17 0702 (!) 146/77     Pulse Rate 09/25/17 0702 75     Resp 09/25/17 0702 18     Temp 09/25/17 0702 98.1 F (36.7 C)     Temp Source 09/25/17 0702 Oral     SpO2 09/25/17 0702 99 %     Weight 09/25/17 0700 200 lb (90.7 kg)     Height 09/25/17 0700 5\' 9"  (1.753 m)     Head Circumference --      Peak Flow --      Pain Score 09/25/17 0700 0     Pain Loc --      Pain Edu? --      Excl. in [place]? --     Constitutional: Alert and oriented. Well appearing and in no acute distress. Eyes: Conjunctivae are normal. PERRL. EOMI. Head: Atraumatic. Nose: No congestion/rhinnorhea. Mouth/Throat: Mucous membranes are moist.  Oropharynx non-erythematous. Neck: No stridor.  No meningeal signs.   Cardiovascular: Normal rate, regular rhythm. Good peripheral circulation. Grossly normal heart sounds.   Respiratory: Normal respiratory effort.  No retractions. Lungs CTAB. Gastrointestinal: Soft and nontender. No distention.  Musculoskeletal: No lower extremity tenderness nor edema. No gross  deformities of extremities. Neurologic:  Normal speech and language. No gross focal neurologic deficits are appreciated. No altered mental status, able to give full seemingly accurate history.  Face is symmetric, EOM's intact, pupils equal and reactive, vision intact, tongue and uvula midline without deviation. Upper and Lower extremity motor 5/5, intact pain perception in distal extremities, 2+ reflexes in biceps, patella and achilles tendons. Able to perform finger to nose normal with both hands.  Skin:  Skin is warm, dry and intact. No rash noted.   ____________________________________________   LABS (all labs ordered are listed, but only  abnormal results are displayed)  Labs Reviewed  COMPREHENSIVE METABOLIC PANEL - Abnormal; Notable for the following components:      Result Value   Total Protein 6.1 (*)    All other components within normal limits  I-STAT CHEM 8, ED - Abnormal; Notable for the following components:   Calcium, Ion 1.12 (*)    All other components within normal limits  PROTIME-INR  APTT  CBC  DIFFERENTIAL  I-STAT TROPONIN, ED  CBG MONITORING, ED   ____________________________________________  EKG   EKG Interpretation  Date/Time:    Ventricular Rate:    PR Interval:    QRS Duration:   QT Interval:    QTC Calculation:   R Axis:     Text Interpretation:         ____________________________________________  RADIOLOGY  Ct Head Wo Contrast  Result Date: 09/25/2017 CLINICAL DATA:  Left upper extremity numbness and tingling EXAM: CT HEAD WITHOUT CONTRAST TECHNIQUE: Contiguous axial images were obtained from the base of the skull through the vertex without intravenous contrast. COMPARISON:  Head CT and brain MRI June 16, 2017 FINDINGS: Brain: Ventricles and sulci are within normal limits for age. There is no intracranial mass, hemorrhage, extra-axial fluid collection, or midline shift. There is slight small vessel disease in the centra semiovale bilaterally. Elsewhere gray-white compartments appear normal. No evident acute infarct. Vascular: No hyperdense vessel. There is calcification in each carotid siphon region. Skull: Bony calvarium appears intact. Sinuses/Orbits: There is opacification throughout much of the sphenoid sinus regions bilaterally with what appears to be polypoid change throughout this area. There are polyps in several ethmoid air cells on the right. There is a sizable polyp or retention cyst in the right maxillary antrum with a smaller probable retention cyst in the anteromedial left maxillary antrum. There is leftward deviation of the nasal septum. Orbits appear symmetric  bilaterally. Other: Mastoid air cells are clear. IMPRESSION: Rather minimal periventricular small vessel disease. No acute infarct evident. No mass or hemorrhage. There are foci of arterial vascular calcification. There is multifocal paranasal sinus disease with what appears to be polypoid change at multiple sites. There is leftward deviation of the nasal septum. Electronically Signed   By: Lowella Grip III M.D.   On: 09/25/2017 07:33   Mr Virgel Paling HD Contrast  Result Date: 09/25/2017 CLINICAL DATA:  Tingling and numbness of the left hand beginning 6 hours ago. EXAM: MR HEAD WITHOUT CONTRAST MR CIRCLE OF WILLIS WITHOUT CONTRAST MRA OF THE NECK WITHOUT AND WITH CONTRAST TECHNIQUE: Multiplanar, multiecho pulse sequences of the brain, circle of willis and surrounding structures were obtained without intravenous contrast. Angiographic images of the neck were obtained using MRA technique without and with intravenous contrast. CONTRAST:  40mL MULTIHANCE GADOBENATE DIMEGLUMINE 529 MG/ML IV SOLN COMPARISON:  CT earlier same day.  MRI 06/16/2017 FINDINGS: MR HEAD FINDINGS Brain: Diffusion imaging does not show any acute or subacute  infarction. The brainstem and cerebellum are normal. Cerebral hemispheres show mild generalized volume loss with mild to moderate chronic small-vessel ischemic changes of the deep white matter, unchanged since the previous study. No cortical or large vessel territory infarction. No mass lesion, hemorrhage, hydrocephalus or extra-axial collection. Vascular: Major vessels at the base of the brain show flow. Skull and upper cervical spine: Negative Sinuses/Orbits: Multiple retention cysts and/or polyps in the sinuses. Orbits negative. Other: None MR CIRCLE OF WILLIS FINDINGS Both internal carotid arteries are widely patent into the brain. No siphon stenosis. The anterior and middle cerebral vessels are patent without proximal stenosis, aneurysm or vascular malformation. Both vertebral  arteries are widely patent to the basilar. The right vertebral artery is quite diminutive, but is patent. No basilar stenosis. Posterior circulation branch vessels appear normal. MRA NECK FINDINGS Branching pattern of the brachiocephalic vessels from the arch is normal. No origin stenoses. Both common carotid arteries are widely patent to their respective bifurcation. Both carotid bifurcations are widely patent without stenosis or significant irregularity. Cervical internal carotid arteries are normal. Both vertebral arteries are patent with the left being dominant. The right vertebral artery is a small vessel but is widely patent through the cervical region. No subclavian disease identified. IMPRESSION: Negative MR angiography of the neck vessels and intracranial vessels. Brain MRI does not show any acute or subacute infarction. Moderate chronic small-vessel ischemic changes of the deep white matter as seen previously, unchanged. Electronically Signed   By: Nelson Chimes M.D.   On: 09/25/2017 11:57   Mr Angiogram Neck W Or Wo Contrast  Result Date: 09/25/2017 CLINICAL DATA:  Tingling and numbness of the left hand beginning 6 hours ago. EXAM: MR HEAD WITHOUT CONTRAST MR CIRCLE OF WILLIS WITHOUT CONTRAST MRA OF THE NECK WITHOUT AND WITH CONTRAST TECHNIQUE: Multiplanar, multiecho pulse sequences of the brain, circle of willis and surrounding structures were obtained without intravenous contrast. Angiographic images of the neck were obtained using MRA technique without and with intravenous contrast. CONTRAST:  50mL MULTIHANCE GADOBENATE DIMEGLUMINE 529 MG/ML IV SOLN COMPARISON:  CT earlier same day.  MRI 06/16/2017 FINDINGS: MR HEAD FINDINGS Brain: Diffusion imaging does not show any acute or subacute infarction. The brainstem and cerebellum are normal. Cerebral hemispheres show mild generalized volume loss with mild to moderate chronic small-vessel ischemic changes of the deep white matter, unchanged since the  previous study. No cortical or large vessel territory infarction. No mass lesion, hemorrhage, hydrocephalus or extra-axial collection. Vascular: Major vessels at the base of the brain show flow. Skull and upper cervical spine: Negative Sinuses/Orbits: Multiple retention cysts and/or polyps in the sinuses. Orbits negative. Other: None MR CIRCLE OF WILLIS FINDINGS Both internal carotid arteries are widely patent into the brain. No siphon stenosis. The anterior and middle cerebral vessels are patent without proximal stenosis, aneurysm or vascular malformation. Both vertebral arteries are widely patent to the basilar. The right vertebral artery is quite diminutive, but is patent. No basilar stenosis. Posterior circulation branch vessels appear normal. MRA NECK FINDINGS Branching pattern of the brachiocephalic vessels from the arch is normal. No origin stenoses. Both common carotid arteries are widely patent to their respective bifurcation. Both carotid bifurcations are widely patent without stenosis or significant irregularity. Cervical internal carotid arteries are normal. Both vertebral arteries are patent with the left being dominant. The right vertebral artery is a small vessel but is widely patent through the cervical region. No subclavian disease identified. IMPRESSION: Negative MR angiography of the neck vessels and intracranial  vessels. Brain MRI does not show any acute or subacute infarction. Moderate chronic small-vessel ischemic changes of the deep white matter as seen previously, unchanged. Electronically Signed   By: Nelson Chimes M.D.   On: 09/25/2017 11:57   Mr Brain Wo Contrast  Result Date: 09/25/2017 CLINICAL DATA:  Tingling and numbness of the left hand beginning 6 hours ago. EXAM: MR HEAD WITHOUT CONTRAST MR CIRCLE OF WILLIS WITHOUT CONTRAST MRA OF THE NECK WITHOUT AND WITH CONTRAST TECHNIQUE: Multiplanar, multiecho pulse sequences of the brain, circle of willis and surrounding structures were  obtained without intravenous contrast. Angiographic images of the neck were obtained using MRA technique without and with intravenous contrast. CONTRAST:  20mL MULTIHANCE GADOBENATE DIMEGLUMINE 529 MG/ML IV SOLN COMPARISON:  CT earlier same day.  MRI 06/16/2017 FINDINGS: MR HEAD FINDINGS Brain: Diffusion imaging does not show any acute or subacute infarction. The brainstem and cerebellum are normal. Cerebral hemispheres show mild generalized volume loss with mild to moderate chronic small-vessel ischemic changes of the deep white matter, unchanged since the previous study. No cortical or large vessel territory infarction. No mass lesion, hemorrhage, hydrocephalus or extra-axial collection. Vascular: Major vessels at the base of the brain show flow. Skull and upper cervical spine: Negative Sinuses/Orbits: Multiple retention cysts and/or polyps in the sinuses. Orbits negative. Other: None MR CIRCLE OF WILLIS FINDINGS Both internal carotid arteries are widely patent into the brain. No siphon stenosis. The anterior and middle cerebral vessels are patent without proximal stenosis, aneurysm or vascular malformation. Both vertebral arteries are widely patent to the basilar. The right vertebral artery is quite diminutive, but is patent. No basilar stenosis. Posterior circulation branch vessels appear normal. MRA NECK FINDINGS Branching pattern of the brachiocephalic vessels from the arch is normal. No origin stenoses. Both common carotid arteries are widely patent to their respective bifurcation. Both carotid bifurcations are widely patent without stenosis or significant irregularity. Cervical internal carotid arteries are normal. Both vertebral arteries are patent with the left being dominant. The right vertebral artery is a small vessel but is widely patent through the cervical region. No subclavian disease identified. IMPRESSION: Negative MR angiography of the neck vessels and intracranial vessels. Brain MRI does not  show any acute or subacute infarction. Moderate chronic small-vessel ischemic changes of the deep white matter as seen previously, unchanged. Electronically Signed   By: Nelson Chimes M.D.   On: 09/25/2017 11:57    ____________________________________________   PROCEDURES  Procedure(s) performed:   Procedures   ____________________________________________   INITIAL IMPRESSION / ASSESSMENT AND PLAN / ED COURSE  Patient had a concern that he has had another TIA or stroke.  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  Also the patient with a recent cranial nerve palsy and having some neck pain as with this tingling he gets not unreasonable to get an MRI of his head and neck to ensure his arterial supply is normal and to ensure there is no nerve impingement or CVA.  If this is normal the patient needs outpatient follow-up with neurology.  MRI negative, outpatient follow up for improving symptoms.      Pertinent labs & imaging results that were available during my care of the patient were reviewed by me and considered in my medical decision making (see chart for details).  ____________________________________________  FINAL CLINICAL IMPRESSION(S) / ED DIAGNOSES  Final diagnoses:  Paresthesia     MEDICATIONS GIVEN DURING THIS  VISIT:  Medications  LORazepam (ATIVAN) injection 1 mg (1 mg Intravenous Given 09/25/17 0941)  gadobenate dimeglumine (MULTIHANCE) injection 20 mL (20 mLs Intravenous Contrast Given 09/25/17 1115)     NEW OUTPATIENT MEDICATIONS STARTED DURING THIS VISIT:  This SmartLink is deprecated. Use AVSMEDLIST instead to display the medication list for a patient.  Note:  This note was prepared with assistance of Dragon voice recognition software. Occasional wrong-word or sound-a-like substitutions may have occurred due to the inherent limitations of voice recognition software.   Merrily Pew, MD 09/25/17 1655

## 2017-09-25 NOTE — ED Notes (Signed)
Patient transported to CT 

## 2017-09-25 NOTE — ED Triage Notes (Signed)
Pt states he woke up around 5 am got some coffee and around 545 he started feeling some tingling and numbness on his left hand finger, pt denies any pain at this time. No neuro deficit noticed.

## 2017-09-29 DIAGNOSIS — C801 Malignant (primary) neoplasm, unspecified: Secondary | ICD-10-CM

## 2017-09-29 HISTORY — DX: Malignant (primary) neoplasm, unspecified: C80.1

## 2017-10-23 ENCOUNTER — Encounter (HOSPITAL_COMMUNITY): Payer: Self-pay

## 2017-10-23 NOTE — Pre-Procedure Instructions (Signed)
ekg 09/26/17 IN EPIC

## 2017-10-27 ENCOUNTER — Other Ambulatory Visit: Payer: Self-pay

## 2017-10-27 ENCOUNTER — Encounter (HOSPITAL_COMMUNITY): Payer: Self-pay

## 2017-10-27 ENCOUNTER — Encounter (HOSPITAL_COMMUNITY)
Admission: RE | Admit: 2017-10-27 | Discharge: 2017-10-27 | Disposition: A | Discharge status: Home / Self Care | Payer: PPO | Source: Ambulatory Visit | Attending: Urology | Admitting: Urology

## 2017-10-27 DIAGNOSIS — C642 Malignant neoplasm of left kidney, except renal pelvis: Secondary | ICD-10-CM | POA: Diagnosis not present

## 2017-10-27 DIAGNOSIS — K219 Gastro-esophageal reflux disease without esophagitis: Secondary | ICD-10-CM | POA: Diagnosis present

## 2017-10-27 DIAGNOSIS — N179 Acute kidney failure, unspecified: Secondary | ICD-10-CM | POA: Diagnosis not present

## 2017-10-27 DIAGNOSIS — K9189 Other postprocedural complications and disorders of digestive system: Secondary | ICD-10-CM | POA: Diagnosis not present

## 2017-10-27 DIAGNOSIS — I10 Essential (primary) hypertension: Secondary | ICD-10-CM | POA: Diagnosis present

## 2017-10-27 DIAGNOSIS — Z8673 Personal history of transient ischemic attack (TIA), and cerebral infarction without residual deficits: Secondary | ICD-10-CM | POA: Diagnosis not present

## 2017-10-27 DIAGNOSIS — Z87442 Personal history of urinary calculi: Secondary | ICD-10-CM | POA: Diagnosis not present

## 2017-10-27 DIAGNOSIS — K567 Ileus, unspecified: Secondary | ICD-10-CM | POA: Diagnosis not present

## 2017-10-27 DIAGNOSIS — Z96642 Presence of left artificial hip joint: Secondary | ICD-10-CM | POA: Diagnosis present

## 2017-10-27 DIAGNOSIS — Z79899 Other long term (current) drug therapy: Secondary | ICD-10-CM | POA: Diagnosis not present

## 2017-10-27 DIAGNOSIS — D49512 Neoplasm of unspecified behavior of left kidney: Secondary | ICD-10-CM | POA: Diagnosis present

## 2017-10-27 HISTORY — DX: Transient cerebral ischemic attack, unspecified: G45.9

## 2017-10-27 HISTORY — DX: Nausea with vomiting, unspecified: R11.2

## 2017-10-27 HISTORY — DX: Diverticulosis of large intestine without perforation or abscess without bleeding: K57.30

## 2017-10-27 HISTORY — DX: Other specified disorders of kidney and ureter: N28.89

## 2017-10-27 HISTORY — DX: Anesthesia of skin: R20.0

## 2017-10-27 HISTORY — DX: Headache: R51

## 2017-10-27 HISTORY — DX: Benign prostatic hyperplasia without lower urinary tract symptoms: N40.0

## 2017-10-27 HISTORY — DX: Unilateral inguinal hernia, without obstruction or gangrene, not specified as recurrent: K40.90

## 2017-10-27 HISTORY — DX: Other specified postprocedural states: Z98.890

## 2017-10-27 HISTORY — DX: Anesthesia of skin: R20.2

## 2017-10-27 HISTORY — DX: Pain in unspecified shoulder: M25.519

## 2017-10-27 HISTORY — DX: Headache, unspecified: R51.9

## 2017-10-27 LAB — ABO/RH: ABO/RH(D): O POS

## 2017-10-27 LAB — BASIC METABOLIC PANEL
Anion gap: 4 — ABNORMAL LOW (ref 5–15)
BUN: 20 mg/dL (ref 6–20)
CALCIUM: 9.4 mg/dL (ref 8.9–10.3)
CHLORIDE: 109 mmol/L (ref 101–111)
CO2: 28 mmol/L (ref 22–32)
CREATININE: 0.89 mg/dL (ref 0.61–1.24)
GFR calc Af Amer: >60 mL/min (ref >60)
GFR calc non Af Amer: >60 mL/min (ref >60)
Glucose, Bld: 89 mg/dL (ref 65–99)
Potassium: 4.9 mmol/L (ref 3.5–5.1)
SODIUM: 141 mmol/L (ref 135–145)

## 2017-10-27 LAB — CBC
HCT: 39.6 % (ref 39.0–52.0)
Hemoglobin: 13.5 g/dL (ref 13.0–17.0)
MCH: 30.8 pg (ref 26.0–34.0)
MCHC: 34.1 g/dL (ref 30.0–36.0)
MCV: 90.4 fL (ref 78.0–100.0)
PLATELETS: 218 10*3/uL (ref 150–400)
RBC: 4.38 MIL/uL (ref 4.22–5.81)
RDW: 13.1 % (ref 11.5–15.5)
WBC: 6.1 10*3/uL (ref 4.0–10.5)

## 2017-10-27 NOTE — Patient Instructions (Addendum)
Bernard Young  10/27/2017   Your procedure is scheduled on: 10-29-17   Report to [facility] Main  Entrance    Report to admitting at 8:45AM   Call this number if you have problems the morning of surgery [phone]     Remember: Do not eat food or drink liquids :After Midnight.     Take these medicines the morning of surgery with A SIP OF WATER: allopurinol, hydrocodone if needed                                You may not have any metal on your body including hair pins and              piercings  Do not wear jewelry, make-up, lotions, powders or perfumes, deodorant                         Men may shave face and neck.   Do not bring valuables to the hospital. [place].  Contacts, dentures or bridgework may not be worn into surgery.  Leave suitcase in the car. After surgery it may be brought to your room.                 Please read over the following fact sheets you were given: _____________________________________________________________________             [facility] - Preparing for Surgery Before surgery, you can play an important role.  Because skin is not sterile, your skin needs to be as free of germs as possible.  You can reduce the number of germs on your skin by washing with CHG (chlorahexidine gluconate) soap before surgery.  CHG is an antiseptic cleaner which kills germs and bonds with the skin to continue killing germs even after washing. Please DO NOT use if you have an allergy to CHG or antibacterial soaps.  If your skin becomes reddened/irritated stop using the CHG and inform your nurse when you arrive at Short Stay. Do not shave (including legs and underarms) for at least 48 hours prior to the first CHG shower.  You may shave your face/neck. Please follow these instructions carefully:  1.  Shower with CHG Soap the night before surgery and the  morning of Surgery.  2.  If you choose to  wash your hair, wash your hair first as usual with your  normal  shampoo.  3.  After you shampoo, rinse your hair and body thoroughly to remove the  shampoo.                           4.  Use CHG as you would any other liquid soap.  You can apply chg directly  to the skin and wash                       Gently with a scrungie or clean washcloth.  5.  Apply the CHG Soap to your body ONLY FROM THE NECK DOWN.   Do not use on face/ open  Wound or open sores. Avoid contact with eyes, ears mouth and genitals (private parts).                       Wash face,  Genitals (private parts) with your normal soap.             6.  Wash thoroughly, paying special attention to the area where your surgery  will be performed.  7.  Thoroughly rinse your body with warm water from the neck down.  8.  DO NOT shower/wash with your normal soap after using and rinsing off  the CHG Soap.                9.  Pat yourself dry with a clean towel.            10.  Wear clean pajamas.            11.  Place clean sheets on your bed the night of your first shower and do not  sleep with pets. Day of Surgery : Do not apply any lotions/deodorants the morning of surgery.  Please wear clean clothes to the hospital/surgery center.  FAILURE TO FOLLOW THESE INSTRUCTIONS MAY RESULT IN THE CANCELLATION OF YOUR SURGERY PATIENT SIGNATURE_________________________________  NURSE SIGNATURE__________________________________  ________________________________________________________________________  WHAT IS A BLOOD TRANSFUSION? Blood Transfusion Information  A transfusion is the replacement of blood or some of its parts. Blood is made up of multiple cells which provide different functions.  Red blood cells carry oxygen and are used for blood loss replacement.  White blood cells fight against infection.  Platelets control bleeding.  Plasma helps clot blood.  Other blood products are available for specialized  needs, such as hemophilia or other clotting disorders. BEFORE THE TRANSFUSION  Who gives blood for transfusions?   Healthy volunteers who are fully evaluated to make sure their blood is safe. This is blood bank blood. Transfusion therapy is the safest it has ever been in the practice of medicine. Before blood is taken from a donor, a complete history is taken to make sure that person has no history of diseases nor engages in risky social behavior (examples are intravenous drug use or sexual activity with multiple partners). The donor's travel history is screened to minimize risk of transmitting infections, such as malaria. The donated blood is tested for signs of infectious diseases, such as HIV and hepatitis. The blood is then tested to be sure it is compatible with you in order to minimize the chance of a transfusion reaction. If you or a relative donates blood, this is often done in anticipation of surgery and is not appropriate for emergency situations. It takes many days to process the donated blood. RISKS AND COMPLICATIONS Although transfusion therapy is very safe and saves many lives, the main dangers of transfusion include:   Getting an infectious disease.  Developing a transfusion reaction. This is an allergic reaction to something in the blood you were given. Every precaution is taken to prevent this. The decision to have a blood transfusion has been considered carefully by your caregiver before blood is given. Blood is not given unless the benefits outweigh the risks. AFTER THE TRANSFUSION  Right after receiving a blood transfusion, you will usually feel much better and more energetic. This is especially true if your red blood cells have gotten low (anemic). The transfusion raises the level of the red blood cells which carry oxygen, and this usually causes an energy increase.  The  nurse administering the transfusion will monitor you carefully for complications. HOME CARE INSTRUCTIONS   No special instructions are needed after a transfusion. You may find your energy is better. Speak with your caregiver about any limitations on activity for underlying diseases you may have. SEEK MEDICAL CARE IF:   Your condition is not improving after your transfusion.  You develop redness or irritation at the intravenous (IV) site. SEEK IMMEDIATE MEDICAL CARE IF:  Any of the following symptoms occur over the next 12 hours:  Shaking chills.  You have a temperature by mouth above 102 F (38.9 C), not controlled by medicine.  Chest, back, or muscle pain.  People around you feel you are not acting correctly or are confused.  Shortness of breath or difficulty breathing.  Dizziness and fainting.  You get a rash or develop hives.  You have a decrease in urine output.  Your urine turns a dark color or changes to pink, red, or brown. Any of the following symptoms occur over the next 10 days:  You have a temperature by mouth above 102 F (38.9 C), not controlled by medicine.  Shortness of breath.  Weakness after normal activity.  The white part of the eye turns yellow (jaundice).  You have a decrease in the amount of urine or are urinating less often.  Your urine turns a dark color or changes to pink, red, or brown. Document Released: 09/12/2000 Document Revised: 12/08/2011 Document Reviewed: 05/01/2008 [facility] Patient Information 2014 [place], [place].  _______________________________________________________________________

## 2017-10-28 NOTE — H&P (Signed)
CC/HPI: CC: Left renal neoplasm    Bernard Young is a 77 year old gentleman who underwent a CT scan of the abdomen and pelvis on 06/26/17 due to nausea and epigastric pain that developed over the previous few weeks. He was incidentally noted to have a 3.1 cm complex exophytic lesion of the interpolar region of the left kidney concerning for a possible renal malignancy. A subsequent MRI of the kidneys with and without contrast was performed on 07/13/17 confirmed a 3.1 cm complex lesion with enhancement of the thickened wall but without clear solid components consistent with a Bosniak III renal cystic lesion. His GI symptoms were felt to be related to gastroesophageal reflux and resolved with proton pump inhibitor therapy.   Family history of kidney cancer: None.  Family history of ESRD: None.   Imaging: MRI (07/13/17)  Side of renal neoplasm: Left  Size of renal neoplasm: 3.1 cm  Location of renal neoplasm: Interpolar  Exophytic or endophytic: Exophytic  Renal nephrometry score: 8x   Renal artery anatomy: Single renal artery  Renal vein anatomy: Single renal vein   Contralateral renal lesions: None.  Regional lymphadenopathy: None.  Adrenal masses: None.  Renal vein/IVC involvement: No.  Metastatic disease to the abdomen: No.   Chest imaging: PENDING  LFTs: Normal.   Baseline renal function: Cr 1.2 , eGFR ml/min   PMH: Past medical history is significant for GERD, urolithiasis, and hypertension.  PSH: Open appendectomy, right inguinal hernia repair.     ALLERGIES: penicillin  sulfa    MEDICATIONS: Allopurinol 100 mg tablet  Hydrochlorothiazide  Hydrocodone-Acetaminophen  Hydrocodone-Acetaminophen 5 mg-325 mg tablet  Losartan Potassium 25 mg tablet  Multivitamin  Pantoprazole Sodium 40 mg tablet, delayed release  Triamcinolone Acetonide 0.025 % cream     GU PSH: None   NON-GU PSH: Appendectomy (open) Hip Replacement, Left Inguinal Hernia Repair > 5 yrs,  Right Remove Kidney Stone    GU PMH: Left renal neoplasm, Left, Right now what he is going to do is give the options that I have discussed with him further consideration. He is going to try to decide whether he would want to proceed with close observation with repeat imaging in 4 months and in the interim a percutaneous biopsy versus surgical excision. He told me he would contact me with his decision in the next few days. 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   Tobacco Use Assessment Completed: Used Tobacco in last 30 days? Has never drank.  Drinks 3 caffeinated drinks per day. Patient's occupation is/was Retired.    REVIEW OF SYSTEMS:    GU Review Male:   Patient reports get up at night to urinate. Patient denies frequent urination, hard to postpone urination, burning/ pain with urination, leakage of urine, stream starts and stops, trouble starting your streams, and have to strain to urinate .  Gastrointestinal (Lower):   Patient denies diarrhea and constipation.  Gastrointestinal (Upper):   Patient denies nausea and vomiting.  Constitutional:   Patient denies fever, night sweats, weight loss, and fatigue.  Skin:   Patient denies skin rash/ lesion and itching.  Eyes:   Patient reports double vision. Patient denies blurred vision.  Ears/ Nose/ Throat:   Patient denies sore throat and sinus problems.  Hematologic/Lymphatic:   Patient denies swollen glands and easy bruising.  Cardiovascular:   Patient denies leg swelling and chest pains.  Respiratory:   Patient denies cough and shortness of breath.  Endocrine:   Patient denies excessive thirst.  Musculoskeletal:   Patient reports joint pain. Patient denies back pain.  Neurological:   Patient denies headaches and dizziness.   Psychologic:   Patient denies depression and anxiety.   VITAL SIGNS:     Weight 182 lb / 82.55 kg    MULTI-SYSTEM PHYSICAL EXAMINATION:    Constitutional: Well-nourished. No physical deformities. Normally developed. Good grooming.  Neck: Neck symmetrical, not swollen. Normal tracheal position.  Respiratory: No labored breathing, no use of accessory muscles. Clear bilaterally.  Cardiovascular: Normal temperature, normal extremity pulses, no swelling, no varicosities. Regular rate and rhythm.  Lymphatic: No enlargement of neck, axillae, groin.  Skin: No paleness, no jaundice, no cyanosis. No lesion, no ulcer, no rash.  Neurologic / Psychiatric: Oriented to time, oriented to place, oriented to person. No depression, no anxiety, no agitation.  Gastrointestinal: No mass, no tenderness, no rigidity, non obese abdomen. Well-healed right lower quadrant incision.  Eyes: Normal conjunctivae. Normal eyelids.  Ears, Nose, Mouth, and Throat: Left ear no scars, no lesions, no masses. Right ear no scars, no lesions, no masses. Nose no scars, no lesions, no masses. Normal hearing. Normal lips.  Musculoskeletal: Normal gait and station of head and neck.      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 Left renal neoplasm:He has elected to proceed with a left robot-assisted laparoscopic partial nephrectomy.

## 2017-10-29 ENCOUNTER — Inpatient Hospital Stay (HOSPITAL_COMMUNITY)
Admission: RE | Admit: 2017-10-29 | Discharge: 2017-10-31 | DRG: 657 | Disposition: A | Discharge status: Home / Self Care | Payer: PPO | Source: Ambulatory Visit | Attending: Urology | Admitting: Urology

## 2017-10-29 ENCOUNTER — Encounter (HOSPITAL_COMMUNITY): Payer: Self-pay | Admitting: *Deleted

## 2017-10-29 ENCOUNTER — Inpatient Hospital Stay (HOSPITAL_COMMUNITY): Payer: PPO | Admitting: Anesthesiology

## 2017-10-29 ENCOUNTER — Encounter (HOSPITAL_COMMUNITY)
Admission: RE | Disposition: A | Discharge status: Home / Self Care | Payer: Self-pay | Source: Ambulatory Visit | Attending: Urology

## 2017-10-29 DIAGNOSIS — K219 Gastro-esophageal reflux disease without esophagitis: Secondary | ICD-10-CM | POA: Diagnosis present

## 2017-10-29 DIAGNOSIS — N179 Acute kidney failure, unspecified: Secondary | ICD-10-CM | POA: Diagnosis not present

## 2017-10-29 DIAGNOSIS — K9189 Other postprocedural complications and disorders of digestive system: Secondary | ICD-10-CM | POA: Diagnosis not present

## 2017-10-29 DIAGNOSIS — Z96642 Presence of left artificial hip joint: Secondary | ICD-10-CM | POA: Diagnosis present

## 2017-10-29 DIAGNOSIS — D49512 Neoplasm of unspecified behavior of left kidney: Secondary | ICD-10-CM | POA: Diagnosis present

## 2017-10-29 DIAGNOSIS — Z87442 Personal history of urinary calculi: Secondary | ICD-10-CM | POA: Diagnosis not present

## 2017-10-29 DIAGNOSIS — Z79899 Other long term (current) drug therapy: Secondary | ICD-10-CM | POA: Diagnosis not present

## 2017-10-29 DIAGNOSIS — K567 Ileus, unspecified: Secondary | ICD-10-CM | POA: Diagnosis not present

## 2017-10-29 DIAGNOSIS — I10 Essential (primary) hypertension: Secondary | ICD-10-CM | POA: Diagnosis present

## 2017-10-29 DIAGNOSIS — Z8673 Personal history of transient ischemic attack (TIA), and cerebral infarction without residual deficits: Secondary | ICD-10-CM | POA: Diagnosis not present

## 2017-10-29 DIAGNOSIS — C642 Malignant neoplasm of left kidney, except renal pelvis: Secondary | ICD-10-CM | POA: Diagnosis not present

## 2017-10-29 HISTORY — PX: ROBOT ASSISTED LAPAROSCOPIC NEPHRECTOMY: SHX5140

## 2017-10-29 LAB — HEMOGLOBIN AND HEMATOCRIT, BLOOD
HCT: 39.1 % (ref 39.0–52.0)
Hemoglobin: 13.2 g/dL (ref 13.0–17.0)

## 2017-10-29 LAB — TYPE AND SCREEN
ABO/RH(D): O POS
Antibody Screen: NEGATIVE

## 2017-10-29 LAB — BASIC METABOLIC PANEL
ANION GAP: 8 (ref 5–15)
BUN: 12 mg/dL (ref 6–20)
CALCIUM: 8.6 mg/dL — AB (ref 8.9–10.3)
CHLORIDE: 104 mmol/L (ref 101–111)
CO2: 27 mmol/L (ref 22–32)
Creatinine, Ser: 1.22 mg/dL (ref 0.61–1.24)
GFR calc non Af Amer: 55 mL/min — ABNORMAL LOW (ref >60)
Glucose, Bld: 131 mg/dL — ABNORMAL HIGH (ref 65–99)
Potassium: 3.7 mmol/L (ref 3.5–5.1)
SODIUM: 139 mmol/L (ref 135–145)

## 2017-10-29 SURGERY — NEPHRECTOMY, RADICAL, ROBOT-ASSISTED, LAPAROSCOPIC, ADULT
Anesthesia: General | Laterality: Left

## 2017-10-29 MED ORDER — SODIUM CHLORIDE 0.9 % IJ SOLN
INTRAMUSCULAR | Status: AC
  Filled 2017-10-29: qty 20

## 2017-10-29 MED ORDER — PROPOFOL 10 MG/ML IV BOLUS
INTRAVENOUS | Status: DC | PRN
  Administered 2017-10-29: 160 mg via INTRAVENOUS

## 2017-10-29 MED ORDER — FENTANYL CITRATE (PF) 100 MCG/2ML IJ SOLN
INTRAMUSCULAR | Status: DC | PRN
  Administered 2017-10-29 (×4): 50 ug via INTRAVENOUS
  Administered 2017-10-29 (×2): 25 ug via INTRAVENOUS

## 2017-10-29 MED ORDER — HYDROMORPHONE HCL 1 MG/ML IJ SOLN
INTRAMUSCULAR | Status: AC
  Filled 2017-10-29: qty 1

## 2017-10-29 MED ORDER — DEXAMETHASONE SODIUM PHOSPHATE 10 MG/ML IJ SOLN
INTRAMUSCULAR | Status: AC
  Filled 2017-10-29: qty 1

## 2017-10-29 MED ORDER — VANCOMYCIN HCL IN DEXTROSE 1-5 GM/200ML-% IV SOLN
1000.0000 mg | Freq: Once | INTRAVENOUS | Status: AC
  Administered 2017-10-29: 1000 mg via INTRAVENOUS
  Filled 2017-10-29: qty 200

## 2017-10-29 MED ORDER — PROPOFOL 10 MG/ML IV BOLUS
INTRAVENOUS | Status: AC
Start: 2017-10-29 — End: ?
  Filled 2017-10-29: qty 20

## 2017-10-29 MED ORDER — MORPHINE SULFATE (PF) 4 MG/ML IV SOLN
2.0000 mg | INTRAVENOUS | Status: DC | PRN
  Administered 2017-10-29 – 2017-10-30 (×3): 2 mg via INTRAVENOUS
  Filled 2017-10-29 (×3): qty 1

## 2017-10-29 MED ORDER — LIDOCAINE HCL (CARDIAC) 20 MG/ML IV SOLN
INTRAVENOUS | Status: DC | PRN
  Administered 2017-10-29: 100 mg via INTRAVENOUS

## 2017-10-29 MED ORDER — BUPIVACAINE LIPOSOME 1.3 % IJ SUSP
20.0000 mL | Freq: Once | INTRAMUSCULAR | Status: AC
  Administered 2017-10-29: 20 mL
  Filled 2017-10-29: qty 20

## 2017-10-29 MED ORDER — DOCUSATE SODIUM 100 MG PO CAPS
100.0000 mg | ORAL_CAPSULE | Freq: Two times a day (BID) | ORAL | Status: DC
  Administered 2017-10-29 – 2017-10-31 (×4): 100 mg via ORAL
  Filled 2017-10-29 (×4): qty 1

## 2017-10-29 MED ORDER — ACETAMINOPHEN 10 MG/ML IV SOLN
INTRAVENOUS | Status: AC
  Filled 2017-10-29: qty 100

## 2017-10-29 MED ORDER — MIDAZOLAM HCL 2 MG/2ML IJ SOLN
INTRAMUSCULAR | Status: AC
  Filled 2017-10-29: qty 2

## 2017-10-29 MED ORDER — ROCURONIUM BROMIDE 100 MG/10ML IV SOLN
INTRAVENOUS | Status: DC | PRN
  Administered 2017-10-29: 20 mg via INTRAVENOUS
  Administered 2017-10-29: 40 mg via INTRAVENOUS
  Administered 2017-10-29: 20 mg via INTRAVENOUS
  Administered 2017-10-29: 10 mg via INTRAVENOUS
  Administered 2017-10-29: 20 mg via INTRAVENOUS

## 2017-10-29 MED ORDER — ROCURONIUM BROMIDE 10 MG/ML (PF) SYRINGE
PREFILLED_SYRINGE | INTRAVENOUS | Status: AC
  Filled 2017-10-29: qty 5

## 2017-10-29 MED ORDER — BUPIVACAINE LIPOSOME 1.3 % IJ SUSP: 20.0000 mL | Freq: Once | INTRAMUSCULAR | Status: DC

## 2017-10-29 MED ORDER — SCOPOLAMINE 1 MG/3DAYS TD PT72
MEDICATED_PATCH | TRANSDERMAL | Status: AC
  Filled 2017-10-29: qty 1

## 2017-10-29 MED ORDER — HYDROCODONE-ACETAMINOPHEN 7.5-325 MG PO TABS: 1.0000 | ORAL_TABLET | Freq: Once | ORAL | Status: DC | PRN

## 2017-10-29 MED ORDER — DIPHENHYDRAMINE HCL 12.5 MG/5ML PO ELIX: 12.5000 mg | ORAL_SOLUTION | Freq: Four times a day (QID) | ORAL | Status: DC | PRN

## 2017-10-29 MED ORDER — SUGAMMADEX SODIUM 200 MG/2ML IV SOLN
INTRAVENOUS | Status: AC
  Filled 2017-10-29: qty 2

## 2017-10-29 MED ORDER — HYDROMORPHONE HCL 1 MG/ML IJ SOLN
0.2500 mg | INTRAMUSCULAR | Status: DC | PRN
  Administered 2017-10-29 (×2): 0.5 mg via INTRAVENOUS

## 2017-10-29 MED ORDER — VANCOMYCIN HCL IN DEXTROSE 1-5 GM/200ML-% IV SOLN
1000.0000 mg | Freq: Two times a day (BID) | INTRAVENOUS | Status: AC
  Administered 2017-10-29: 1000 mg via INTRAVENOUS
  Filled 2017-10-29: qty 200

## 2017-10-29 MED ORDER — ONDANSETRON HCL 4 MG/2ML IJ SOLN
INTRAMUSCULAR | Status: AC
  Filled 2017-10-29: qty 2

## 2017-10-29 MED ORDER — STERILE WATER FOR IRRIGATION IR SOLN
Status: DC | PRN
  Administered 2017-10-29: 1000 mL

## 2017-10-29 MED ORDER — BUPIVACAINE-EPINEPHRINE 0.25% -1:200000 IJ SOLN
INTRAMUSCULAR | Status: DC | PRN
  Administered 2017-10-29: 5 mL

## 2017-10-29 MED ORDER — DIPHENHYDRAMINE HCL 50 MG/ML IJ SOLN: 12.5000 mg | Freq: Four times a day (QID) | INTRAMUSCULAR | Status: DC | PRN

## 2017-10-29 MED ORDER — DEXAMETHASONE SODIUM PHOSPHATE 10 MG/ML IJ SOLN
INTRAMUSCULAR | Status: DC | PRN
  Administered 2017-10-29: 10 mg via INTRAVENOUS

## 2017-10-29 MED ORDER — ALLOPURINOL 100 MG PO TABS
100.0000 mg | ORAL_TABLET | Freq: Every day | ORAL | Status: DC
  Administered 2017-10-29 – 2017-10-31 (×3): 100 mg via ORAL
  Filled 2017-10-29 (×3): qty 1

## 2017-10-29 MED ORDER — HYDROCODONE-ACETAMINOPHEN 5-325 MG PO TABS: 1.0000 | ORAL_TABLET | Freq: Four times a day (QID) | ORAL | 0 refills | Status: DC | PRN

## 2017-10-29 MED ORDER — SUGAMMADEX SODIUM 200 MG/2ML IV SOLN
INTRAVENOUS | Status: DC | PRN
  Administered 2017-10-29: 175 mg via INTRAVENOUS

## 2017-10-29 MED ORDER — DEXTROSE-NACL 5-0.45 % IV SOLN
INTRAVENOUS | Status: DC
  Administered 2017-10-29 – 2017-10-30 (×3): via INTRAVENOUS

## 2017-10-29 MED ORDER — EPHEDRINE SULFATE-NACL 50-0.9 MG/10ML-% IV SOSY
PREFILLED_SYRINGE | INTRAVENOUS | Status: DC | PRN
  Administered 2017-10-29 (×2): 5 mg via INTRAVENOUS
  Administered 2017-10-29: 10 mg via INTRAVENOUS

## 2017-10-29 MED ORDER — MIDAZOLAM HCL 5 MG/5ML IJ SOLN
INTRAMUSCULAR | Status: DC | PRN
  Administered 2017-10-29: 2 mg via INTRAVENOUS

## 2017-10-29 MED ORDER — LACTATED RINGERS IR SOLN
Status: DC | PRN
  Administered 2017-10-29: 1000 mL

## 2017-10-29 MED ORDER — ONDANSETRON HCL 4 MG/2ML IJ SOLN
4.0000 mg | INTRAMUSCULAR | Status: DC | PRN
  Administered 2017-10-30 – 2017-10-31 (×2): 4 mg via INTRAVENOUS
  Filled 2017-10-29 (×2): qty 2

## 2017-10-29 MED ORDER — LACTATED RINGERS IV SOLN
INTRAVENOUS | Status: DC
  Administered 2017-10-29 (×2): via INTRAVENOUS

## 2017-10-29 MED ORDER — BUPIVACAINE-EPINEPHRINE 0.25% -1:200000 IJ SOLN
INTRAMUSCULAR | Status: AC
  Filled 2017-10-29: qty 1

## 2017-10-29 MED ORDER — SCOPOLAMINE 1 MG/3DAYS TD PT72
1.0000 | MEDICATED_PATCH | Freq: Once | TRANSDERMAL | Status: DC
  Administered 2017-10-29: 1.5 mg via TRANSDERMAL

## 2017-10-29 MED ORDER — EPHEDRINE 5 MG/ML INJ
INTRAVENOUS | Status: AC
  Filled 2017-10-29: qty 10

## 2017-10-29 MED ORDER — ONDANSETRON HCL 4 MG/2ML IJ SOLN
INTRAMUSCULAR | Status: DC | PRN
  Administered 2017-10-29: 4 mg via INTRAVENOUS

## 2017-10-29 MED ORDER — ACETAMINOPHEN 10 MG/ML IV SOLN
1000.0000 mg | Freq: Four times a day (QID) | INTRAVENOUS | Status: DC
  Administered 2017-10-29 – 2017-10-30 (×2): 1000 mg via INTRAVENOUS
  Filled 2017-10-29 (×3): qty 100

## 2017-10-29 MED ORDER — FENTANYL CITRATE (PF) 250 MCG/5ML IJ SOLN
INTRAMUSCULAR | Status: AC
  Filled 2017-10-29: qty 5

## 2017-10-29 MED ORDER — MEPERIDINE HCL 50 MG/ML IJ SOLN: 6.2500 mg | INTRAMUSCULAR | Status: DC | PRN

## 2017-10-29 MED ORDER — PROMETHAZINE HCL 25 MG/ML IJ SOLN: 6.2500 mg | INTRAMUSCULAR | Status: DC | PRN

## 2017-10-29 MED ORDER — LOSARTAN POTASSIUM 25 MG PO TABS
25.0000 mg | ORAL_TABLET | Freq: Every day | ORAL | Status: DC
  Administered 2017-10-29 – 2017-10-31 (×3): 25 mg via ORAL
  Filled 2017-10-29 (×3): qty 1

## 2017-10-29 MED ORDER — ACETAMINOPHEN 10 MG/ML IV SOLN
1000.0000 mg | Freq: Once | INTRAVENOUS | Status: DC | PRN
  Administered 2017-10-29: 1000 mg via INTRAVENOUS

## 2017-10-29 SURGICAL SUPPLY — 51 items
CHLORAPREP W/TINT 26ML (MISCELLANEOUS) ×3 IMPLANT
CLIP VESOLOCK LG 6/CT PURPLE (CLIP) ×3 IMPLANT
CLIP VESOLOCK MED LG 6/CT (CLIP) ×6 IMPLANT
COVER SURGICAL LIGHT HANDLE (MISCELLANEOUS) ×3 IMPLANT
COVER TIP SHEARS 8 DVNC (MISCELLANEOUS) ×1 IMPLANT
COVER TIP SHEARS 8MM DA VINCI (MISCELLANEOUS) ×2
DECANTER SPIKE VIAL GLASS SM (MISCELLANEOUS) IMPLANT
DERMABOND ADVANCED (GAUZE/BANDAGES/DRESSINGS) ×4
DERMABOND ADVANCED .7 DNX12 (GAUZE/BANDAGES/DRESSINGS) ×2 IMPLANT
DRAIN CHANNEL 15F RND FF 3/16 (WOUND CARE) ×3 IMPLANT
DRAPE ARM DVNC X/XI (DISPOSABLE) ×4 IMPLANT
DRAPE COLUMN DVNC XI (DISPOSABLE) ×1 IMPLANT
DRAPE DA VINCI XI ARM (DISPOSABLE) ×8
DRAPE DA VINCI XI COLUMN (DISPOSABLE) ×2
DRAPE INCISE IOBAN 66X45 STRL (DRAPES) ×3 IMPLANT
DRAPE SHEET LG 3/4 BI-LAMINATE (DRAPES) ×3 IMPLANT
ELECT PENCIL ROCKER SW 15FT (MISCELLANEOUS) ×3 IMPLANT
ELECT REM PT RETURN 15FT ADLT (MISCELLANEOUS) ×3 IMPLANT
EVACUATOR SILICONE 100CC (DRAIN) ×3 IMPLANT
FLOSEAL 10ML (HEMOSTASIS) ×3 IMPLANT
GLOVE BIO SURGEON STRL SZ 6.5 (GLOVE) ×2 IMPLANT
GLOVE BIO SURGEONS STRL SZ 6.5 (GLOVE) ×1
GLOVE BIOGEL M STRL SZ7.5 (GLOVE) ×6 IMPLANT
GOWN STRL REUS W/TWL LRG LVL3 (GOWN DISPOSABLE) ×9 IMPLANT
IRRIG SUCT STRYKERFLOW 2 WTIP (MISCELLANEOUS)
IRRIGATION SUCT STRKRFLW 2 WTP (MISCELLANEOUS) IMPLANT
KIT BASIN OR (CUSTOM PROCEDURE TRAY) ×3 IMPLANT
NS IRRIG 1000ML POUR BTL (IV SOLUTION) IMPLANT
POSITIONER SURGICAL ARM (MISCELLANEOUS) ×3 IMPLANT
POUCH SPECIMEN RETRIEVAL 10MM (ENDOMECHANICALS) ×3 IMPLANT
SEAL CANN UNIV 5-8 DVNC XI (MISCELLANEOUS) ×4 IMPLANT
SEAL XI 5MM-8MM UNIVERSAL (MISCELLANEOUS) ×8
SOLUTION ELECTROLUBE (MISCELLANEOUS) ×3 IMPLANT
SURGIFLO W/THROMBIN 8M KIT (HEMOSTASIS) IMPLANT
SUT ETHILON 3 0 PS 1 (SUTURE) ×3 IMPLANT
SUT MNCRL AB 4-0 PS2 18 (SUTURE) ×6 IMPLANT
SUT PDS AB 0 CTX 36 PDP370T (SUTURE) IMPLANT
SUT V-LOC BARB 180 2/0GR6 GS22 (SUTURE) ×3
SUT VIC AB 0 CT1 27 (SUTURE) ×2
SUT VIC AB 0 CT1 27XBRD ANTBC (SUTURE) ×1 IMPLANT
SUT VICRYL 0 UR6 27IN ABS (SUTURE) ×6 IMPLANT
SUT VLOC BARB 180 ABS3/0GR12 (SUTURE) ×6
SUTURE V-LC BRB 180 2/0GR6GS22 (SUTURE) ×1 IMPLANT
SUTURE VLOC BRB 180 ABS3/0GR12 (SUTURE) ×2 IMPLANT
TOWEL OR 17X26 10 PK STRL BLUE (TOWEL DISPOSABLE) ×3 IMPLANT
TOWEL OR NON WOVEN STRL DISP B (DISPOSABLE) ×3 IMPLANT
TRAY FOLEY W/METER SILVER 16FR (SET/KITS/TRAYS/PACK) ×3 IMPLANT
TRAY LAPAROSCOPIC (CUSTOM PROCEDURE TRAY) ×3 IMPLANT
TROCAR UNIVERSAL OPT 12M 100M (ENDOMECHANICALS) IMPLANT
TROCAR XCEL 12X100 BLDLESS (ENDOMECHANICALS) ×3 IMPLANT
WATER STERILE IRR 1000ML POUR (IV SOLUTION) ×3 IMPLANT

## 2017-10-29 NOTE — Anesthesia Postprocedure Evaluation (Signed)
Anesthesia Post Note  Patient: Bernard [name]  Procedure(s) Performed: XI ROBOTIC ASSISTED LAPAROSCOPIC / PARTIAL NEPHRECTOMY (Left )     Patient location during evaluation: PACU Anesthesia Type: General Level of consciousness: awake and alert Pain management: pain level controlled Vital Signs Assessment: post-procedure vital signs reviewed and stable Respiratory status: spontaneous breathing, nonlabored ventilation, respiratory function stable and patient connected to nasal cannula oxygen Cardiovascular status: blood pressure returned to baseline and stable Postop Assessment: no apparent nausea or vomiting Anesthetic complications: no    Last Vitals:  Vitals:   10/29/17 1545 10/29/17 1600  BP: (!) 152/95 (!) 144/69  Pulse: 93 94  Resp: 13 13  Temp:    SpO2: 98% 96%    Last Pain:  Vitals:   10/29/17 1600  TempSrc:   PainSc: 5                  Barnet Glasgow

## 2017-10-29 NOTE — Anesthesia Preprocedure Evaluation (Addendum)
Anesthesia Evaluation  Patient identified by MRN, date of birth, ID band Patient awake    Reviewed: Allergy & Precautions, NPO status , Patient's Chart, lab work & pertinent test results  History of Anesthesia Complications (+) PONV  Airway Mallampati: II  TM Distance: >3 FB Neck ROM: Full    Dental no notable dental hx.    Pulmonary neg pulmonary ROS,    Pulmonary exam normal breath sounds clear to auscultation       Cardiovascular hypertension, Normal cardiovascular exam Rhythm:Regular Rate:Normal     Neuro/Psych negative neurological ROS  negative psych ROS   GI/Hepatic negative GI ROS, Neg liver ROS,   Endo/Other  negative endocrine ROS  Renal/GU negative Renal ROS  negative genitourinary   Musculoskeletal negative musculoskeletal ROS (+)   Abdominal   Peds  Hematology negative hematology ROS (+)   Anesthesia Other Findings   Reproductive/Obstetrics                            Anesthesia Physical Anesthesia Plan  ASA: II  Anesthesia Plan: General   Post-op Pain Management:    Induction: Intravenous  PONV Risk Score and Plan:   Airway Management Planned: Oral ETT  Additional Equipment:   Intra-op Plan:   Post-operative Plan: Extubation in OR  Informed Consent: I have reviewed the patients History and Physical, chart, labs and discussed the procedure including the risks, benefits and alternatives for the proposed anesthesia with the patient or authorized representative who has indicated his/her understanding and acceptance.   Dental advisory given  Plan Discussed with: CRNA  Anesthesia Plan Comments:        Anesthesia Quick Evaluation

## 2017-10-29 NOTE — Progress Notes (Signed)
Post-op note  Subjective: The patient is doing well.  No complaints. Pain controlled.  Objective: Vital signs in last 24 hours: Temp:  [98 F (36.7 C)-98.5 F (36.9 C)] 98.5 F (36.9 C) (01/31 1630) Pulse Rate:  [72-94] 89 (01/31 1630) Resp:  [13-20] 16 (01/31 1630) BP: (132-158)/(69-95) 149/85 (01/31 1630) SpO2:  [96 %-100 %] 98 % (01/31 1630) Weight:  [83.5 kg (184 lb)] 83.5 kg (184 lb) (01/31 0917)  Intake/Output from previous day: No intake/output data recorded. Intake/Output this shift: Total I/O In: 2100 [I.V.:2000; IV Piggyback:100] Out: 505 [Urine:235; Drains:20; Blood:250]  Physical Exam:  General: Alert and oriented. Abdomen: Soft, Nondistended. Incisions: Clean and dry.  Lab Results: Recent Labs    10/27/17 1512 10/29/17 1512  HGB 13.5 13.2  HCT 39.6 39.1    Assessment/Plan: POD#0   1) Continue to monitor, bedrest tonight.   Roxy Horseman, Brooke Bonito. MD   LOS: 0 days   Nyesha Cliff,LES 10/29/2017, 5:45 PM

## 2017-10-29 NOTE — Progress Notes (Signed)
Pt transferred from PACU to 1442. AO x 4. Foley cath and JP drain. Orientated to the room, no questions or concerns at this time.

## 2017-10-29 NOTE — Discharge Instructions (Signed)

## 2017-10-29 NOTE — Op Note (Signed)
Preoperative diagnosis: Left renal neoplasm  Postoperative diagnosis: Left renal neoplasm  Procedure:  1. Left robotic-assisted laparoscopic partial nephrectomy 2. Intraoperative renal ultrasonography  Surgeon: Pryor Curia. M.D.  Assistant(s): Debbrah Alar, PA-C  An assistant was required for this surgical procedure.  The duties of the assistant included but were not limited to suctioning, passing suture, camera manipulation, retraction. This procedure would not be able to be performed without an Environmental consultant.  Resident: Dr. Jonna Clark  Anesthesia: General  Complications: None  EBL: 250 mL  IVF:  1750 mL crystalloid  Specimens: 1. Left renal neoplasm  Disposition of specimens: Pathology  Intraoperative findings:       1. Warm renal ischemia time: 23 minutes       2. Intraoperative renal ultrasound findings: There was noted to be a 3 cm solid appearing exophytic mass off the anterolateral surface of the interpolar kidney.  Drains: 1. # 15 Blake perinephric drain  Indication:  Bernard [name] is a 77 y.o. year old patient with a left renal neoplasm.  After a thorough review of the management options for their renal mass, they elected to proceed with surgical treatment and the above procedure.  We have discussed the potential benefits and risks of the procedure, side effects of the proposed treatment, the likelihood of the patient achieving the goals of the procedure, and any potential problems that might occur during the procedure or recuperation. Informed consent has been obtained.   Description of procedure:  The patient was taken to the operating room and a general anesthetic was administered. The patient was given preoperative antibiotics, placed in the left modified flank position with care to pad all potential pressure points, and prepped and draped in the usual sterile fashion. Next a preoperative timeout was performed.  A site was selected in the upper  midline for initial port placement. This was placed using a standard open Hassan technique which allowed entry into the peritoneal cavity under direct vision and without difficulty. A 12 mm port was placed and a pneumoperitoneum established. The camera was then used to inspect the abdomen and there was no evidence of any intra-abdominal injuries or other abnormalities. The remaining abdominal ports were then placed. 8 mm robotic ports were placed in the left upper quadrant, left lower quadrant, and far left lateral abdominal wall. A 8 mm port was placed to the left of the midline just off the rectus muscle for the camera.. All ports were placed under direct vision without difficulty. The surgical cart was then docked.   Utilizing the cautery scissors, the white line of Toldt was incised allowing the colon to be mobilized medially and the plane between the mesocolon and the anterior layer of Gerota's fascia to be developed and the kidney to be exposed.  The ureter and gonadal vein were identified inferiorly and the ureter was lifted anteriorly off the psoas muscle.  Dissection proceeded superiorly along the gonadal vein until the renal vein was identified.  The renal hilum was then carefully isolated with a combination of blunt and sharp dissection allowing the renal arterial and venous structures to be separated and isolated in preparation for renal hilar vessel clamping.  There was a single renal artery and renal vein.  Attention turned to the kidney and the perinephric fat surrounding the renal mass was removed and the kidney was mobilized sufficiently for exposure and resection of the renal mass.   Intraoperative renal ultrasonography was utilized with the laparoscopic ultrasound probe to identify the renal  tumor and identify the tumor margins.   Once the renal mass was properly isolated, preparations were made for resection of the tumor.  Reconstructive sutures were placed into the abdomen for the  renorrhaphy portion of the procedure.  The renal artery was then clamped with bulldog clamps.  The tumor was then excised with cold scissor dissection along with an adequate visible gross margin of normal renal parenchyma. The tumor appeared to be excised without any gross violation of the tumor. The renal collecting system was entered during removal of the tumor.  A running 3-0 V-lock suture was then brought through the capsule of the kidney and run along the base of the renal defect to provide hemostasis and close any entry into the renal collecting system if present. Weck clips were used to secure this suture outside the renal capsule at the proximal and distal ends. An additional hemostatic agent (Floseal) was then placed into the renal defect. A running 2-0 V lock suture was then used to close the renal capsule using a sliding clip technique which resulted in excellent compression of the renal defect.    The bulldog clamps were then removed from the renal hilar vessel(s). Total warm renal ischemia time was 23 minutes. The renal tumor resection site was examined. Hemostasis appeared adequate.   The kidney was placed back into its normal anatomic position and covered with perinephric fat as needed.  A # 64 Blake drain was then brought through the lateral lower port site and positioned in the perinephric space.  It was secured to the skin with a nylon suture. The surgical cart was undocked.  The renal tumor specimen was removed intact within an endopouch retrieval bag via the upper midline port site.  All other laparoscopic/robotic ports had been removed under direct vision and the pneumoperitoneum let down with inspection of the operative field performed and hemostasis again confirmed. The upper midline incision was then closed at the fascial level with 0-vicryl suture. All incision sites were then injected with local anesthetic and reapproximated at the skin level with 4-0 monocryl subcuticular closures.   Liquiband was applied to the skin.  The patient tolerated the procedure well and without complications.  The patient was able to be extubated and transferred to the recovery unit in satisfactory condition.  Pryor Curia MD

## 2017-10-29 NOTE — Anesthesia Procedure Notes (Signed)
Procedure Name: Intubation Date/Time: 10/29/2017 11:25 AM Performed by: Glory Buff, CRNA Pre-anesthesia Checklist: Patient identified, Emergency Drugs available, Suction available, Patient being monitored and Timeout performed Patient Re-evaluated:Patient Re-evaluated prior to induction Oxygen Delivery Method: Circle system utilized Preoxygenation: Pre-oxygenation with 100% oxygen Induction Type: IV induction Ventilation: Oral airway inserted - appropriate to patient size and Mask ventilation without difficulty Laryngoscope Size: Miller and 3 Grade View: Grade I Tube type: Oral Tube size: 7.5 mm Number of attempts: 2 Airway Equipment and Method: Stylet Placement Confirmation: ETT inserted through vocal cords under direct vision,  positive ETCO2 and breath sounds checked- equal and bilateral Secured at: 23 (at lips) cm Tube secured with: Tape Dental Injury: Teeth and Oropharynx as per pre-operative assessment and Injury to lip

## 2017-10-29 NOTE — Transfer of Care (Signed)
Immediate Anesthesia Transfer of Care Note  Patient: Bernard [name]  Procedure(s) Performed: XI ROBOTIC ASSISTED LAPAROSCOPIC / PARTIAL NEPHRECTOMY (Left )  Patient Location: PACU  Anesthesia Type:General  Level of Consciousness: awake  Airway & Oxygen Therapy: Patient Spontanous Breathing and Patient connected to face mask oxygen  Post-op Assessment: Report given to RN and Post -op Vital signs reviewed and stable  Post vital signs: Reviewed and stable  Last Vitals:  Vitals:   10/29/17 1046 10/29/17 1051  BP: 134/83 (!) 142/77  Pulse: 73 75  Resp:    Temp:    SpO2: 99% 99%    Last Pain:  Vitals:   10/29/17 0819  TempSrc: Oral         Complications: No apparent anesthesia complications

## 2017-10-30 ENCOUNTER — Encounter (HOSPITAL_COMMUNITY): Payer: Self-pay | Admitting: Urology

## 2017-10-30 ENCOUNTER — Other Ambulatory Visit: Payer: Self-pay

## 2017-10-30 LAB — BASIC METABOLIC PANEL
ANION GAP: 6 (ref 5–15)
BUN: 16 mg/dL (ref 6–20)
CHLORIDE: 101 mmol/L (ref 101–111)
CO2: 28 mmol/L (ref 22–32)
Calcium: 8.3 mg/dL — ABNORMAL LOW (ref 8.9–10.3)
Creatinine, Ser: 1.38 mg/dL — ABNORMAL HIGH (ref 0.61–1.24)
GFR calc non Af Amer: 48 mL/min — ABNORMAL LOW (ref >60)
GFR, EST AFRICAN AMERICAN: 55 mL/min — AB (ref >60)
GLUCOSE: 177 mg/dL — AB (ref 65–99)
Potassium: 4.3 mmol/L (ref 3.5–5.1)
Sodium: 135 mmol/L (ref 135–145)

## 2017-10-30 LAB — HEMOGLOBIN AND HEMATOCRIT, BLOOD
HEMATOCRIT: 34.5 % — AB (ref 39.0–52.0)
Hemoglobin: 11.8 g/dL — ABNORMAL LOW (ref 13.0–17.0)

## 2017-10-30 MED ORDER — BISACODYL 10 MG RE SUPP: 10.0000 mg | Freq: Three times a day (TID) | RECTAL | Status: DC | PRN

## 2017-10-30 MED ORDER — KCL IN DEXTROSE-NACL 20-5-0.45 MEQ/L-%-% IV SOLN
INTRAVENOUS | Status: DC
  Administered 2017-10-30 (×2): via INTRAVENOUS
  Filled 2017-10-30 (×4): qty 1000

## 2017-10-30 MED ORDER — HYDROCODONE-ACETAMINOPHEN 5-325 MG PO TABS
1.0000 | ORAL_TABLET | Freq: Four times a day (QID) | ORAL | Status: DC | PRN
  Administered 2017-10-30 – 2017-10-31 (×4): 1 via ORAL
  Filled 2017-10-30 (×4): qty 1

## 2017-10-30 MED ORDER — METOCLOPRAMIDE HCL 5 MG/ML IJ SOLN
5.0000 mg | Freq: Four times a day (QID) | INTRAMUSCULAR | Status: DC
  Administered 2017-10-30 – 2017-10-31 (×4): 5 mg via INTRAVENOUS
  Filled 2017-10-30 (×4): qty 2

## 2017-10-30 MED ORDER — BISACODYL 10 MG RE SUPP
10.0000 mg | Freq: Once | RECTAL | Status: AC
Start: 2017-10-30 — End: 2017-10-30
  Administered 2017-10-30: 10 mg via RECTAL
  Filled 2017-10-30: qty 1

## 2017-10-30 MED ORDER — OXYCODONE-ACETAMINOPHEN 5-325 MG PO TABS: 1.0000 | ORAL_TABLET | ORAL | Status: DC | PRN

## 2017-10-30 NOTE — Progress Notes (Signed)
Patient ID: Bernard Young, male   DOB: 11-29-1940, 77 y.o.   MRN: [id number]  1 Day Post-Op Subjective: Pt with increased abdominal distention and nausea.  No vomiting.  Ambulated once.  He has had flatus and BM today.  Objective: Vital signs in last 24 hours: Temp:  [97.7 F (36.5 C)-98.5 F (36.9 C)] 98.4 F (36.9 C) (02/01 1256) Pulse Rate:  [61-84] 67 (02/01 1256) Resp:  [16-19] 16 (02/01 1256) BP: (110-127)/(59-71) 126/60 (02/01 1256) SpO2:  [98 %-100 %] 100 % (02/01 1256)  Intake/Output from previous day: 01/31 0701 - 02/01 0700 In: 4877.5 [P.O.:60; I.V.:4317.5; IV Piggyback:500] Out: 9826 [Urine:1205; Drains:99; Blood:250] Intake/Output this shift: Total I/O In: 423.8 [P.O.:240; I.V.:183.8] Out: 855 [Urine:825; Drains:30]  Physical Exam:  General: Alert and oriented Abdomen: Soft, Distended, decreased bowel sounds Incisions: C/D/I Ext: NT, No erythema  Lab Results: Recent Labs    10/29/17 1512 10/30/17 0406  HGB 13.2 11.8*  HCT 39.1 34.5*   BMET Recent Labs    10/29/17 1512 10/30/17 0406  NA 139 135  K 3.7 4.3  CL 104 101  CO2 27 28  GLUCOSE 131* 177*  BUN 12 16  CREATININE 1.22 1.38*  CALCIUM 8.6* 8.3*   Path pending.  Studies/Results: No results found.  Assessment/Plan: Start Reglan, ambulate Tentative plan for discharge tomorrow pending return of bowel function Will leave IV fluids tonight   LOS: 1 day   Bazil Dhanani,LES 10/30/2017, 5:25 PM

## 2017-10-30 NOTE — Progress Notes (Signed)
Urology Progress Note   1 Day Post-Op  Subjective: Doing well this AM. Tolerating clears without nausea or vomiting. Pain well controlled with morphine, has not trialed PO medicine yet for pain control. Ambulated yesterday.   Objective: Vital signs in last 24 hours: Temp:  [97.7 F (36.5 C)-98.5 F (36.9 C)] 98.5 F (36.9 C) (02/01 0920) Pulse Rate:  [61-94] 61 (02/01 0920) Resp:  [13-20] 16 (02/01 0920) BP: (110-157)/(59-95) 110/59 (02/01 0920) SpO2:  [96 %-100 %] 100 % (02/01 0920)  Intake/Output from previous day: 01/31 0701 - 02/01 0700 In: 4877.5 [P.O.:60; I.V.:4317.5; IV Piggyback:500] Out: 8832 [Urine:1205; Drains:99; Blood:250] Intake/Output this shift: Total I/O In: -  Out: 270 [Urine:250; Drains:20]  Physical Exam:  General: Alert and oriented CV: RRR Lungs: Clear Abdomen: Soft,  Incisions clean dry and intact with dermabond in place. JP drain with serosanguinous drainage in bulb  GU: Foley with clear yellow urine in tubing  Ext: NT, No erythema  Lab Results: Recent Labs    10/27/17 1512 10/29/17 1512 10/30/17 0406  HGB 13.5 13.2 11.8*  HCT 39.6 39.1 34.5*   BMET Recent Labs    10/29/17 1512 10/30/17 0406  NA 139 135  K 3.7 4.3  CL 104 101  CO2 27 28  GLUCOSE 131* 177*  BUN 12 16  CREATININE 1.22 1.38*  CALCIUM 8.6* 8.3*     Studies/Results: No results found.  Assessment/Plan:  77 y.o. male POD 1 s/p robotic LEFT partial nephretomy for a renal mass  - remove catheter today, TOV - will watch JP output, if minimal after patient voids will d/c -  Regular diet, IVF @ 75cc - ambulate patient - if patient normalizes well and pain is well tolerated, may be able to d/c today versus tomorrow     LOS: 1 day   Lamel Mccarley L 10/30/2017, 12:21 PM

## 2017-10-30 NOTE — Progress Notes (Signed)
JP site leaking. Dressing reinforced this morning. Will continue to monitor patient.

## 2017-10-31 LAB — BASIC METABOLIC PANEL
Anion gap: 5 (ref 5–15)
BUN: 14 mg/dL (ref 6–20)
CALCIUM: 8.7 mg/dL — AB (ref 8.9–10.3)
CO2: 27 mmol/L (ref 22–32)
Chloride: 106 mmol/L (ref 101–111)
Creatinine, Ser: 1.25 mg/dL — ABNORMAL HIGH (ref 0.61–1.24)
GFR calc Af Amer: >60 mL/min (ref >60)
GFR, EST NON AFRICAN AMERICAN: 54 mL/min — AB (ref >60)
GLUCOSE: 107 mg/dL — AB (ref 65–99)
POTASSIUM: 4.4 mmol/L (ref 3.5–5.1)
SODIUM: 138 mmol/L (ref 135–145)

## 2017-10-31 LAB — HEMOGLOBIN AND HEMATOCRIT, BLOOD
HCT: 35.1 % — ABNORMAL LOW (ref 39.0–52.0)
HEMOGLOBIN: 11.8 g/dL — AB (ref 13.0–17.0)

## 2017-10-31 LAB — GLUCOSE, CAPILLARY: Glucose-Capillary: 135 mg/dL — ABNORMAL HIGH (ref 65–99)

## 2017-10-31 MED ORDER — ONDANSETRON HCL 4 MG PO TABS: 4.0000 mg | ORAL_TABLET | Freq: Four times a day (QID) | ORAL | 0 refills | Status: DC | PRN

## 2017-10-31 NOTE — Progress Notes (Signed)
Pt discharged from the unit via wheelchair. Discharge instructions reviewed with patient and wife. No questions or concerns for the RN at this time.

## 2017-10-31 NOTE — Progress Notes (Signed)
2 Days Post-Op  Subjective: Bernard Young is POD#2 from a left robotic partial nephrectomy.  continues to have mid abdominal pain with nausea but it is improving.  He passed some flatus but his po intake is poor.   He is voiding well and his Cr has fallen to 1.25.  ROS:  Review of Systems  All other systems reviewed and are negative.   Anti-infectives: Anti-infectives (From admission, onward)   Start     Dose/Rate Route Frequency Ordered Stop   10/29/17 2200  vancomycin (VANCOCIN) IVPB 1000 mg/200 mL premix     1,000 mg 200 mL/hr over 60 Minutes Intravenous Every 12 hours 10/29/17 1637 10/29/17 2351   10/29/17 0830  vancomycin (VANCOCIN) IVPB 1000 mg/200 mL premix     1,000 mg 200 mL/hr over 60 Minutes Intravenous  Once 10/29/17 0824 10/29/17 1127      Current Facility-Administered Medications  Medication Dose Route Frequency Provider Last Rate Last Dose  . allopurinol (ZYLOPRIM) tablet 100 mg  100 mg Oral Daily Raynelle Bring, MD   100 mg at 10/30/17 0819  . bisacodyl (DULCOLAX) suppository 10 mg  10 mg Rectal Q8H PRN Raynelle Bring, MD      . dextrose 5 % and 0.45 % NaCl with KCl 20 mEq/L infusion   Intravenous Continuous Raynelle Bring, MD 75 mL/hr at 10/30/17 2229    . diphenhydrAMINE (BENADRYL) injection 12.5-25 mg  12.5-25 mg Intravenous Q6H PRN Raynelle Bring, MD       Or  . diphenhydrAMINE (BENADRYL) 12.5 MG/5ML elixir 12.5-25 mg  12.5-25 mg Oral Q6H PRN Raynelle Bring, MD      . docusate sodium (COLACE) capsule 100 mg  100 mg Oral BID Raynelle Bring, MD   100 mg at 10/30/17 2115  . HYDROcodone-acetaminophen (NORCO/VICODIN) 5-325 MG per tablet 1-2 tablet  1-2 tablet Oral Q6H PRN Raynelle Bring, MD   1 tablet at 10/31/17 0419  . losartan (COZAAR) tablet 25 mg  25 mg Oral Daily Raynelle Bring, MD   25 mg at 10/30/17 0258  . metoCLOPramide (REGLAN) injection 5 mg  5 mg Intravenous Q6H Raynelle Bring, MD   5 mg at 10/31/17 0636  . ondansetron (ZOFRAN) injection 4 mg  4 mg  Intravenous Q4H PRN Raynelle Bring, MD   4 mg at 10/31/17 0416     Objective: Vital signs in last 24 hours: Temp:  [98.4 F (36.9 C)-98.8 F (37.1 C)] 98.8 F (37.1 C) (02/02 0420) Pulse Rate:  [61-71] 69 (02/02 0420) Resp:  [16] 16 (02/02 0420) BP: (110-133)/(59-76) 133/76 (02/02 0420) SpO2:  [98 %-100 %] 100 % (02/02 0420)  Intake/Output from previous day: 02/01 0701 - 02/02 0700 In: 1623.8 [P.O.:460; I.V.:1163.8] Out: 1795 [NIDPO:2423; Drains:30] Intake/Output this shift: No intake/output data recorded.   Physical Exam  Constitutional: He is oriented to person, place, and time and well-developed, well-nourished, and in no distress.  HENT:  Head: Normocephalic and atraumatic.  Cardiovascular: Normal rate, regular rhythm and normal heart sounds.  Pulmonary/Chest: Effort normal and breath sounds normal. No respiratory distress.  Abdominal: He exhibits distension (mild). There is tenderness (diffuse).  BS are quiet  Musculoskeletal: Normal range of motion. He exhibits no edema or tenderness.  Neurological: He is alert and oriented to person, place, and time.  Skin: Skin is warm and dry.  Psychiatric: Mood and affect normal.    Lab Results:  Recent Labs    10/30/17 0406 10/31/17 0458  HGB 11.8* 11.8*  HCT 34.5* 35.1*   BMET  Recent Labs    10/30/17 0406 10/31/17 0458  NA 135 138  K 4.3 4.4  CL 101 106  CO2 28 27  GLUCOSE 177* 107*  BUN 16 14  CREATININE 1.38* 1.25*  CALCIUM 8.3* 8.7*   PT/INR No results for input(s): LABPROT, INR in the last 72 hours. ABG No results for input(s): PHART, HCO3 in the last 72 hours.  Invalid input(s): PCO2, PO2  Studies/Results: No results found.   Assessment and Plan: S/P Left robotic partial nephrectomy with persistent but improving ileus with nausea.  AKI is improving.  I will continue to observe him and have encouraged him to walk.  Anticipate D/C in AM.       LOS: 2 days    Irine Seal 10/31/2017 [phone]

## 2017-11-02 NOTE — Discharge Summary (Signed)
Physician Discharge Summary  Patient ID: Bernard Young MRN: [id number] DOB/AGE: 1941/09/17 77 y.o.  Admit date: 10/29/2017 Discharge date: 11/02/2017  Admission Diagnoses:  Neoplasm of left kidney  Discharge Diagnoses:  Principal Problem:   Neoplasm of left kidney   Past Medical History:  Diagnosis Date  . Arthritis   . Diverticulosis of colon 05/2017   noted on CT abd/pelvis  . Enlarged prostate 06/26/2017   mild , noted on CT abd/pelvis  . Headache   . Hypertension   . Inguinal hernia 05/2017   small to moderate right containing bowel loop of sigmoid colon, noted on CT abd/pelvis  . Numbness and tingling in left hand   . PONV (postoperative nausea and vomiting)    very very bad nausea but no vomiting : "i get so sick of it and it is just awful ; even with a full stomach i cant vomit "   . Renal mass, left   . Shoulder pain    right   . TIA (transient ischemic attack)    nearly 20 years ago     Surgeries: Procedure(s): XI ROBOTIC ASSISTED LAPAROSCOPIC / PARTIAL NEPHRECTOMY on 10/29/2017   Consultants (if any):   Discharged Condition: Improved  Hospital Course: Bernard Young is an 77 y.o. male who was admitted 10/29/2017 with a diagnosis of Neoplasm of left kidney and went to the operating room on 10/29/2017 and underwent the above named procedures.   He had a mild postop ileus that resolved by 2/2 and he was felt to be ready for discharge home.   He was given perioperative antibiotics:  Anti-infectives (From admission, onward)   Start     Dose/Rate Route Frequency Ordered Stop   10/29/17 2200  vancomycin (VANCOCIN) IVPB 1000 mg/200 mL premix     1,000 mg 200 mL/hr over 60 Minutes Intravenous Every 12 hours 10/29/17 1637 10/29/17 2351   10/29/17 0830  vancomycin (VANCOCIN) IVPB 1000 mg/200 mL premix     1,000 mg 200 mL/hr over 60 Minutes Intravenous  Once 10/29/17 0824 10/29/17 1127    .  He was given sequential compression devices and early ambulation for DVT  prophylaxis.  He benefited maximally from the hospital stay and there were no complications.    Recent vital signs:  Vitals:   10/31/17 0420 10/31/17 1408  BP: 133/76 102/62  Pulse: 69 68  Resp: 16 20  Temp: 98.8 F (37.1 C) 98 F (36.7 C)  SpO2: 100% 97%    Recent laboratory studies:  Lab Results  Component Value Date   HGB 11.8 (L) 10/31/2017   HGB 11.8 (L) 10/30/2017   HGB 13.2 10/29/2017   Lab Results  Component Value Date   WBC 6.1 10/27/2017   PLT 218 10/27/2017   Lab Results  Component Value Date   INR 0.94 09/25/2017   Lab Results  Component Value Date   NA 138 10/31/2017   K 4.4 10/31/2017   CL 106 10/31/2017   CO2 27 10/31/2017   BUN 14 10/31/2017   CREATININE 1.25 (H) 10/31/2017   GLUCOSE 107 (H) 10/31/2017    Discharge Medications:   Allergies as of 10/31/2017      Reactions   Sulfa Antibiotics Other (See Comments)   Unknown childhood reaction   Penicillins Rash   Has patient had a PCN reaction causing immediate rash, facial/tongue/throat swelling, SOB or lightheadedness with hypotension: No Has patient had a PCN reaction causing severe rash involving mucus membranes or skin necrosis: No Has patient had  a PCN reaction that required hospitalization: No Has patient had a PCN reaction occurring within the last 10 years: No If all of the above answers are "NO", then may proceed with Cephalosporin use.      Medication List    STOP taking these medications   multivitamin with minerals Tabs tablet     TAKE these medications   allopurinol 100 MG tablet Commonly known as:  ZYLOPRIM Take 100 mg by mouth daily.   HYDROcodone-acetaminophen 5-325 MG tablet Commonly known as:  NORCO Take 1-2 tablets by mouth every 6 (six) hours as needed for moderate pain or severe pain. What changed:  reasons to take this   losartan 25 MG tablet Commonly known as:  COZAAR Take 25 mg by mouth daily.   ondansetron 4 MG tablet Commonly known as:  ZOFRAN Take 1  tablet (4 mg total) by mouth every 6 (six) hours as needed for nausea or vomiting.   triamcinolone cream 0.1 % Commonly known as:  KENALOG Apply 1 application topically daily as needed (for psoriasis rash).       Diagnostic Studies: No results found.  Disposition: 01-Home or Self Care  Discharge Instructions    Discontinue IV   Complete by:  As directed       Follow-up Information    Raynelle Bring, MD Follow up on 11/18/2017.   Specialty:  Urology Why:  at 12:45 Contact information: [place] [address] [phone]            Signed: Irine Seal 11/02/2017, 7:03 AM

## 2017-11-18 DIAGNOSIS — C642 Malignant neoplasm of left kidney, except renal pelvis: Secondary | ICD-10-CM | POA: Diagnosis not present

## 2017-12-31 DIAGNOSIS — Z1389 Encounter for screening for other disorder: Secondary | ICD-10-CM | POA: Diagnosis not present

## 2017-12-31 DIAGNOSIS — N281 Cyst of kidney, acquired: Secondary | ICD-10-CM | POA: Diagnosis not present

## 2017-12-31 DIAGNOSIS — Z0000 Encounter for general adult medical examination without abnormal findings: Secondary | ICD-10-CM | POA: Diagnosis not present

## 2017-12-31 DIAGNOSIS — M19071 Primary osteoarthritis, right ankle and foot: Secondary | ICD-10-CM | POA: Diagnosis not present

## 2017-12-31 DIAGNOSIS — Z8659 Personal history of other mental and behavioral disorders: Secondary | ICD-10-CM | POA: Diagnosis not present

## 2017-12-31 DIAGNOSIS — M109 Gout, unspecified: Secondary | ICD-10-CM | POA: Diagnosis not present

## 2017-12-31 DIAGNOSIS — H4922 Sixth [abducent] nerve palsy, left eye: Secondary | ICD-10-CM | POA: Diagnosis not present

## 2017-12-31 DIAGNOSIS — Z79899 Other long term (current) drug therapy: Secondary | ICD-10-CM | POA: Diagnosis not present

## 2017-12-31 DIAGNOSIS — M170 Bilateral primary osteoarthritis of knee: Secondary | ICD-10-CM | POA: Diagnosis not present

## 2017-12-31 DIAGNOSIS — C649 Malignant neoplasm of unspecified kidney, except renal pelvis: Secondary | ICD-10-CM | POA: Diagnosis not present

## 2017-12-31 DIAGNOSIS — I10 Essential (primary) hypertension: Secondary | ICD-10-CM | POA: Diagnosis not present

## 2018-02-25 DIAGNOSIS — R1013 Epigastric pain: Secondary | ICD-10-CM | POA: Diagnosis not present

## 2018-02-25 DIAGNOSIS — K219 Gastro-esophageal reflux disease without esophagitis: Secondary | ICD-10-CM | POA: Diagnosis not present

## 2018-02-25 DIAGNOSIS — Z1211 Encounter for screening for malignant neoplasm of colon: Secondary | ICD-10-CM | POA: Diagnosis not present

## 2018-03-23 DIAGNOSIS — Z1211 Encounter for screening for malignant neoplasm of colon: Secondary | ICD-10-CM | POA: Diagnosis not present

## 2018-03-23 DIAGNOSIS — Z1212 Encounter for screening for malignant neoplasm of rectum: Secondary | ICD-10-CM | POA: Diagnosis not present

## 2018-03-29 DIAGNOSIS — K2900 Acute gastritis without bleeding: Secondary | ICD-10-CM | POA: Diagnosis not present

## 2018-03-29 DIAGNOSIS — Z79899 Other long term (current) drug therapy: Secondary | ICD-10-CM | POA: Diagnosis not present

## 2018-04-20 DIAGNOSIS — K317 Polyp of stomach and duodenum: Secondary | ICD-10-CM | POA: Diagnosis not present

## 2018-04-20 DIAGNOSIS — K2980 Duodenitis without bleeding: Secondary | ICD-10-CM | POA: Diagnosis not present

## 2018-04-20 DIAGNOSIS — K210 Gastro-esophageal reflux disease with esophagitis: Secondary | ICD-10-CM | POA: Diagnosis not present

## 2018-04-20 DIAGNOSIS — R1013 Epigastric pain: Secondary | ICD-10-CM | POA: Diagnosis not present

## 2018-04-20 DIAGNOSIS — K5730 Diverticulosis of large intestine without perforation or abscess without bleeding: Secondary | ICD-10-CM | POA: Diagnosis not present

## 2018-04-20 DIAGNOSIS — R195 Other fecal abnormalities: Secondary | ICD-10-CM | POA: Diagnosis not present

## 2018-04-20 DIAGNOSIS — D126 Benign neoplasm of colon, unspecified: Secondary | ICD-10-CM | POA: Diagnosis not present

## 2018-04-20 DIAGNOSIS — K2930 Chronic superficial gastritis without bleeding: Secondary | ICD-10-CM | POA: Diagnosis not present

## 2018-04-20 DIAGNOSIS — K648 Other hemorrhoids: Secondary | ICD-10-CM | POA: Diagnosis not present

## 2018-04-22 DIAGNOSIS — K317 Polyp of stomach and duodenum: Secondary | ICD-10-CM | POA: Diagnosis not present

## 2018-04-22 DIAGNOSIS — K2980 Duodenitis without bleeding: Secondary | ICD-10-CM | POA: Diagnosis not present

## 2018-04-22 DIAGNOSIS — D126 Benign neoplasm of colon, unspecified: Secondary | ICD-10-CM | POA: Diagnosis not present

## 2018-04-22 DIAGNOSIS — K210 Gastro-esophageal reflux disease with esophagitis: Secondary | ICD-10-CM | POA: Diagnosis not present

## 2018-04-22 DIAGNOSIS — K2930 Chronic superficial gastritis without bleeding: Secondary | ICD-10-CM | POA: Diagnosis not present

## 2018-05-14 DIAGNOSIS — N281 Cyst of kidney, acquired: Secondary | ICD-10-CM | POA: Diagnosis not present

## 2018-05-14 DIAGNOSIS — I10 Essential (primary) hypertension: Secondary | ICD-10-CM | POA: Diagnosis not present

## 2018-05-14 DIAGNOSIS — D126 Benign neoplasm of colon, unspecified: Secondary | ICD-10-CM | POA: Diagnosis not present

## 2018-05-14 DIAGNOSIS — Z79899 Other long term (current) drug therapy: Secondary | ICD-10-CM | POA: Diagnosis not present

## 2018-05-14 DIAGNOSIS — L219 Seborrheic dermatitis, unspecified: Secondary | ICD-10-CM | POA: Diagnosis not present

## 2018-05-14 DIAGNOSIS — C649 Malignant neoplasm of unspecified kidney, except renal pelvis: Secondary | ICD-10-CM | POA: Diagnosis not present

## 2018-05-14 DIAGNOSIS — H4922 Sixth [abducent] nerve palsy, left eye: Secondary | ICD-10-CM | POA: Diagnosis not present

## 2018-05-14 DIAGNOSIS — Z8659 Personal history of other mental and behavioral disorders: Secondary | ICD-10-CM | POA: Diagnosis not present

## 2018-05-14 DIAGNOSIS — M170 Bilateral primary osteoarthritis of knee: Secondary | ICD-10-CM | POA: Diagnosis not present

## 2018-05-14 DIAGNOSIS — M19071 Primary osteoarthritis, right ankle and foot: Secondary | ICD-10-CM | POA: Diagnosis not present

## 2018-05-14 DIAGNOSIS — M109 Gout, unspecified: Secondary | ICD-10-CM | POA: Diagnosis not present

## 2018-05-19 DIAGNOSIS — C649 Malignant neoplasm of unspecified kidney, except renal pelvis: Secondary | ICD-10-CM | POA: Diagnosis not present

## 2018-05-19 DIAGNOSIS — C642 Malignant neoplasm of left kidney, except renal pelvis: Secondary | ICD-10-CM | POA: Diagnosis not present

## 2018-06-02 DIAGNOSIS — C642 Malignant neoplasm of left kidney, except renal pelvis: Secondary | ICD-10-CM | POA: Diagnosis not present

## 2018-07-05 DIAGNOSIS — L723 Sebaceous cyst: Secondary | ICD-10-CM | POA: Diagnosis not present

## 2018-08-17 DIAGNOSIS — E790 Hyperuricemia without signs of inflammatory arthritis and tophaceous disease: Secondary | ICD-10-CM | POA: Diagnosis not present

## 2018-08-17 DIAGNOSIS — M19071 Primary osteoarthritis, right ankle and foot: Secondary | ICD-10-CM | POA: Diagnosis not present

## 2018-08-17 DIAGNOSIS — H4922 Sixth [abducent] nerve palsy, left eye: Secondary | ICD-10-CM | POA: Diagnosis not present

## 2018-08-17 DIAGNOSIS — C649 Malignant neoplasm of unspecified kidney, except renal pelvis: Secondary | ICD-10-CM | POA: Diagnosis not present

## 2018-08-17 DIAGNOSIS — I10 Essential (primary) hypertension: Secondary | ICD-10-CM | POA: Diagnosis not present

## 2018-08-17 DIAGNOSIS — E7800 Pure hypercholesterolemia, unspecified: Secondary | ICD-10-CM | POA: Diagnosis not present

## 2018-08-17 DIAGNOSIS — M170 Bilateral primary osteoarthritis of knee: Secondary | ICD-10-CM | POA: Diagnosis not present

## 2018-08-17 DIAGNOSIS — Z79899 Other long term (current) drug therapy: Secondary | ICD-10-CM | POA: Diagnosis not present

## 2018-08-17 DIAGNOSIS — M109 Gout, unspecified: Secondary | ICD-10-CM | POA: Diagnosis not present

## 2018-08-20 DIAGNOSIS — M19019 Primary osteoarthritis, unspecified shoulder: Secondary | ICD-10-CM | POA: Diagnosis not present

## 2018-08-20 DIAGNOSIS — M25511 Pain in right shoulder: Secondary | ICD-10-CM | POA: Diagnosis not present

## 2018-08-20 DIAGNOSIS — M7541 Impingement syndrome of right shoulder: Secondary | ICD-10-CM | POA: Diagnosis not present

## 2018-09-24 DIAGNOSIS — K047 Periapical abscess without sinus: Secondary | ICD-10-CM | POA: Diagnosis not present

## 2018-10-05 DIAGNOSIS — C649 Malignant neoplasm of unspecified kidney, except renal pelvis: Secondary | ICD-10-CM | POA: Diagnosis not present

## 2018-10-05 DIAGNOSIS — Z79899 Other long term (current) drug therapy: Secondary | ICD-10-CM | POA: Diagnosis not present

## 2018-10-05 DIAGNOSIS — M19071 Primary osteoarthritis, right ankle and foot: Secondary | ICD-10-CM | POA: Diagnosis not present

## 2018-10-05 DIAGNOSIS — M109 Gout, unspecified: Secondary | ICD-10-CM | POA: Diagnosis not present

## 2018-10-05 DIAGNOSIS — M170 Bilateral primary osteoarthritis of knee: Secondary | ICD-10-CM | POA: Diagnosis not present

## 2018-10-05 DIAGNOSIS — H4922 Sixth [abducent] nerve palsy, left eye: Secondary | ICD-10-CM | POA: Diagnosis not present

## 2018-10-05 DIAGNOSIS — E7800 Pure hypercholesterolemia, unspecified: Secondary | ICD-10-CM | POA: Diagnosis not present

## 2018-10-05 DIAGNOSIS — I10 Essential (primary) hypertension: Secondary | ICD-10-CM | POA: Diagnosis not present

## 2018-10-06 DIAGNOSIS — Z86010 Personal history of colonic polyps: Secondary | ICD-10-CM | POA: Diagnosis not present

## 2018-10-06 DIAGNOSIS — K219 Gastro-esophageal reflux disease without esophagitis: Secondary | ICD-10-CM | POA: Diagnosis not present

## 2018-11-29 DIAGNOSIS — C642 Malignant neoplasm of left kidney, except renal pelvis: Secondary | ICD-10-CM | POA: Diagnosis not present

## 2018-12-02 DIAGNOSIS — N200 Calculus of kidney: Secondary | ICD-10-CM | POA: Diagnosis not present

## 2018-12-02 DIAGNOSIS — J984 Other disorders of lung: Secondary | ICD-10-CM | POA: Diagnosis not present

## 2018-12-02 DIAGNOSIS — C642 Malignant neoplasm of left kidney, except renal pelvis: Secondary | ICD-10-CM | POA: Diagnosis not present

## 2018-12-08 DIAGNOSIS — Z85528 Personal history of other malignant neoplasm of kidney: Secondary | ICD-10-CM | POA: Diagnosis not present

## 2018-12-08 DIAGNOSIS — D49511 Neoplasm of unspecified behavior of right kidney: Secondary | ICD-10-CM | POA: Diagnosis not present

## 2019-01-20 DIAGNOSIS — C649 Malignant neoplasm of unspecified kidney, except renal pelvis: Secondary | ICD-10-CM | POA: Diagnosis not present

## 2019-01-20 DIAGNOSIS — Z79899 Other long term (current) drug therapy: Secondary | ICD-10-CM | POA: Diagnosis not present

## 2019-01-20 DIAGNOSIS — M19071 Primary osteoarthritis, right ankle and foot: Secondary | ICD-10-CM | POA: Diagnosis not present

## 2019-01-20 DIAGNOSIS — M109 Gout, unspecified: Secondary | ICD-10-CM | POA: Diagnosis not present

## 2019-01-20 DIAGNOSIS — H4922 Sixth [abducent] nerve palsy, left eye: Secondary | ICD-10-CM | POA: Diagnosis not present

## 2019-01-20 DIAGNOSIS — I10 Essential (primary) hypertension: Secondary | ICD-10-CM | POA: Diagnosis not present

## 2019-01-20 DIAGNOSIS — E7800 Pure hypercholesterolemia, unspecified: Secondary | ICD-10-CM | POA: Diagnosis not present

## 2019-01-20 DIAGNOSIS — Z0000 Encounter for general adult medical examination without abnormal findings: Secondary | ICD-10-CM | POA: Diagnosis not present

## 2019-01-20 DIAGNOSIS — M170 Bilateral primary osteoarthritis of knee: Secondary | ICD-10-CM | POA: Diagnosis not present

## 2019-01-25 DIAGNOSIS — N410 Acute prostatitis: Secondary | ICD-10-CM | POA: Diagnosis not present

## 2019-05-04 DIAGNOSIS — E7800 Pure hypercholesterolemia, unspecified: Secondary | ICD-10-CM | POA: Diagnosis not present

## 2019-05-04 DIAGNOSIS — M170 Bilateral primary osteoarthritis of knee: Secondary | ICD-10-CM | POA: Diagnosis not present

## 2019-05-04 DIAGNOSIS — I10 Essential (primary) hypertension: Secondary | ICD-10-CM | POA: Diagnosis not present

## 2019-05-04 DIAGNOSIS — M109 Gout, unspecified: Secondary | ICD-10-CM | POA: Diagnosis not present

## 2019-05-04 DIAGNOSIS — C649 Malignant neoplasm of unspecified kidney, except renal pelvis: Secondary | ICD-10-CM | POA: Diagnosis not present

## 2019-05-04 DIAGNOSIS — M19071 Primary osteoarthritis, right ankle and foot: Secondary | ICD-10-CM | POA: Diagnosis not present

## 2019-05-04 DIAGNOSIS — H4922 Sixth [abducent] nerve palsy, left eye: Secondary | ICD-10-CM | POA: Diagnosis not present

## 2019-05-04 DIAGNOSIS — Z79899 Other long term (current) drug therapy: Secondary | ICD-10-CM | POA: Diagnosis not present

## 2019-06-01 DIAGNOSIS — M47812 Spondylosis without myelopathy or radiculopathy, cervical region: Secondary | ICD-10-CM | POA: Diagnosis not present

## 2019-07-05 DIAGNOSIS — M25511 Pain in right shoulder: Secondary | ICD-10-CM | POA: Diagnosis not present

## 2019-07-15 DIAGNOSIS — Z85528 Personal history of other malignant neoplasm of kidney: Secondary | ICD-10-CM | POA: Diagnosis not present

## 2019-07-19 DIAGNOSIS — D49511 Neoplasm of unspecified behavior of right kidney: Secondary | ICD-10-CM | POA: Diagnosis not present

## 2019-07-19 DIAGNOSIS — C649 Malignant neoplasm of unspecified kidney, except renal pelvis: Secondary | ICD-10-CM | POA: Diagnosis not present

## 2019-07-22 DIAGNOSIS — D49511 Neoplasm of unspecified behavior of right kidney: Secondary | ICD-10-CM | POA: Diagnosis not present

## 2019-10-20 DIAGNOSIS — H43813 Vitreous degeneration, bilateral: Secondary | ICD-10-CM | POA: Diagnosis not present

## 2019-11-24 DIAGNOSIS — H2513 Age-related nuclear cataract, bilateral: Secondary | ICD-10-CM | POA: Diagnosis not present

## 2019-11-24 DIAGNOSIS — H524 Presbyopia: Secondary | ICD-10-CM | POA: Diagnosis not present

## 2019-12-07 DIAGNOSIS — Z86010 Personal history of colonic polyps: Secondary | ICD-10-CM | POA: Diagnosis not present

## 2019-12-07 DIAGNOSIS — K219 Gastro-esophageal reflux disease without esophagitis: Secondary | ICD-10-CM | POA: Diagnosis not present

## 2019-12-09 DIAGNOSIS — M47812 Spondylosis without myelopathy or radiculopathy, cervical region: Secondary | ICD-10-CM | POA: Diagnosis not present

## 2019-12-09 DIAGNOSIS — M19071 Primary osteoarthritis, right ankle and foot: Secondary | ICD-10-CM | POA: Diagnosis not present

## 2019-12-09 DIAGNOSIS — M170 Bilateral primary osteoarthritis of knee: Secondary | ICD-10-CM | POA: Diagnosis not present

## 2019-12-09 DIAGNOSIS — M19019 Primary osteoarthritis, unspecified shoulder: Secondary | ICD-10-CM | POA: Diagnosis not present

## 2019-12-09 DIAGNOSIS — E7800 Pure hypercholesterolemia, unspecified: Secondary | ICD-10-CM | POA: Diagnosis not present

## 2019-12-09 DIAGNOSIS — I10 Essential (primary) hypertension: Secondary | ICD-10-CM | POA: Diagnosis not present

## 2019-12-09 DIAGNOSIS — C649 Malignant neoplasm of unspecified kidney, except renal pelvis: Secondary | ICD-10-CM | POA: Diagnosis not present

## 2020-01-20 DIAGNOSIS — Z85528 Personal history of other malignant neoplasm of kidney: Secondary | ICD-10-CM | POA: Diagnosis not present

## 2020-01-24 DIAGNOSIS — R918 Other nonspecific abnormal finding of lung field: Secondary | ICD-10-CM | POA: Diagnosis not present

## 2020-01-24 DIAGNOSIS — Z85528 Personal history of other malignant neoplasm of kidney: Secondary | ICD-10-CM | POA: Diagnosis not present

## 2020-01-24 DIAGNOSIS — K5730 Diverticulosis of large intestine without perforation or abscess without bleeding: Secondary | ICD-10-CM | POA: Diagnosis not present

## 2020-01-26 DIAGNOSIS — D49511 Neoplasm of unspecified behavior of right kidney: Secondary | ICD-10-CM | POA: Diagnosis not present

## 2020-01-26 DIAGNOSIS — K4090 Unilateral inguinal hernia, without obstruction or gangrene, not specified as recurrent: Secondary | ICD-10-CM | POA: Diagnosis not present

## 2020-01-26 DIAGNOSIS — C642 Malignant neoplasm of left kidney, except renal pelvis: Secondary | ICD-10-CM | POA: Diagnosis not present

## 2020-02-07 DIAGNOSIS — Z96642 Presence of left artificial hip joint: Secondary | ICD-10-CM | POA: Diagnosis not present

## 2020-02-07 DIAGNOSIS — M7541 Impingement syndrome of right shoulder: Secondary | ICD-10-CM | POA: Diagnosis not present

## 2020-02-07 DIAGNOSIS — F5101 Primary insomnia: Secondary | ICD-10-CM | POA: Diagnosis not present

## 2020-02-07 DIAGNOSIS — Z0000 Encounter for general adult medical examination without abnormal findings: Secondary | ICD-10-CM | POA: Diagnosis not present

## 2020-02-07 DIAGNOSIS — Z79899 Other long term (current) drug therapy: Secondary | ICD-10-CM | POA: Diagnosis not present

## 2020-02-07 DIAGNOSIS — K219 Gastro-esophageal reflux disease without esophagitis: Secondary | ICD-10-CM | POA: Diagnosis not present

## 2020-02-07 DIAGNOSIS — E7800 Pure hypercholesterolemia, unspecified: Secondary | ICD-10-CM | POA: Diagnosis not present

## 2020-02-07 DIAGNOSIS — M1711 Unilateral primary osteoarthritis, right knee: Secondary | ICD-10-CM | POA: Diagnosis not present

## 2020-02-07 DIAGNOSIS — I10 Essential (primary) hypertension: Secondary | ICD-10-CM | POA: Diagnosis not present

## 2020-02-07 DIAGNOSIS — L299 Pruritus, unspecified: Secondary | ICD-10-CM | POA: Diagnosis not present

## 2020-02-07 DIAGNOSIS — N529 Male erectile dysfunction, unspecified: Secondary | ICD-10-CM | POA: Diagnosis not present

## 2020-02-07 DIAGNOSIS — E790 Hyperuricemia without signs of inflammatory arthritis and tophaceous disease: Secondary | ICD-10-CM | POA: Diagnosis not present

## 2020-03-29 DIAGNOSIS — G8929 Other chronic pain: Secondary | ICD-10-CM | POA: Diagnosis not present

## 2020-03-29 DIAGNOSIS — M25511 Pain in right shoulder: Secondary | ICD-10-CM | POA: Diagnosis not present

## 2020-05-10 DIAGNOSIS — M19011 Primary osteoarthritis, right shoulder: Secondary | ICD-10-CM | POA: Diagnosis not present

## 2020-05-10 DIAGNOSIS — M25511 Pain in right shoulder: Secondary | ICD-10-CM | POA: Diagnosis not present

## 2020-05-28 DIAGNOSIS — H2513 Age-related nuclear cataract, bilateral: Secondary | ICD-10-CM | POA: Diagnosis not present

## 2020-05-28 DIAGNOSIS — H52203 Unspecified astigmatism, bilateral: Secondary | ICD-10-CM | POA: Diagnosis not present

## 2020-06-07 DIAGNOSIS — M170 Bilateral primary osteoarthritis of knee: Secondary | ICD-10-CM | POA: Diagnosis not present

## 2020-06-07 DIAGNOSIS — M1711 Unilateral primary osteoarthritis, right knee: Secondary | ICD-10-CM | POA: Diagnosis not present

## 2020-06-07 DIAGNOSIS — M19019 Primary osteoarthritis, unspecified shoulder: Secondary | ICD-10-CM | POA: Diagnosis not present

## 2020-06-07 DIAGNOSIS — M19071 Primary osteoarthritis, right ankle and foot: Secondary | ICD-10-CM | POA: Diagnosis not present

## 2020-06-07 DIAGNOSIS — E7800 Pure hypercholesterolemia, unspecified: Secondary | ICD-10-CM | POA: Diagnosis not present

## 2020-06-07 DIAGNOSIS — C649 Malignant neoplasm of unspecified kidney, except renal pelvis: Secondary | ICD-10-CM | POA: Diagnosis not present

## 2020-06-07 DIAGNOSIS — M47812 Spondylosis without myelopathy or radiculopathy, cervical region: Secondary | ICD-10-CM | POA: Diagnosis not present

## 2020-06-07 DIAGNOSIS — K219 Gastro-esophageal reflux disease without esophagitis: Secondary | ICD-10-CM | POA: Diagnosis not present

## 2020-06-07 DIAGNOSIS — I10 Essential (primary) hypertension: Secondary | ICD-10-CM | POA: Diagnosis not present

## 2020-06-12 DIAGNOSIS — K219 Gastro-esophageal reflux disease without esophagitis: Secondary | ICD-10-CM | POA: Diagnosis not present

## 2020-06-12 DIAGNOSIS — K317 Polyp of stomach and duodenum: Secondary | ICD-10-CM | POA: Diagnosis not present

## 2020-06-12 DIAGNOSIS — R634 Abnormal weight loss: Secondary | ICD-10-CM | POA: Diagnosis not present

## 2020-06-21 DIAGNOSIS — M19011 Primary osteoarthritis, right shoulder: Secondary | ICD-10-CM | POA: Diagnosis not present

## 2020-06-28 DIAGNOSIS — M19011 Primary osteoarthritis, right shoulder: Secondary | ICD-10-CM | POA: Diagnosis not present

## 2020-06-28 DIAGNOSIS — M25511 Pain in right shoulder: Secondary | ICD-10-CM | POA: Diagnosis not present

## 2020-07-19 DIAGNOSIS — Z1159 Encounter for screening for other viral diseases: Secondary | ICD-10-CM | POA: Diagnosis not present

## 2020-07-20 DIAGNOSIS — D49511 Neoplasm of unspecified behavior of right kidney: Secondary | ICD-10-CM | POA: Diagnosis not present

## 2020-07-20 DIAGNOSIS — Z85528 Personal history of other malignant neoplasm of kidney: Secondary | ICD-10-CM | POA: Diagnosis not present

## 2020-07-24 DIAGNOSIS — R1310 Dysphagia, unspecified: Secondary | ICD-10-CM | POA: Diagnosis not present

## 2020-07-24 DIAGNOSIS — K317 Polyp of stomach and duodenum: Secondary | ICD-10-CM | POA: Diagnosis not present

## 2020-07-24 DIAGNOSIS — K2980 Duodenitis without bleeding: Secondary | ICD-10-CM | POA: Diagnosis not present

## 2020-07-24 DIAGNOSIS — K2970 Gastritis, unspecified, without bleeding: Secondary | ICD-10-CM | POA: Diagnosis not present

## 2020-07-27 DIAGNOSIS — K317 Polyp of stomach and duodenum: Secondary | ICD-10-CM | POA: Diagnosis not present

## 2020-08-09 DIAGNOSIS — C649 Malignant neoplasm of unspecified kidney, except renal pelvis: Secondary | ICD-10-CM | POA: Diagnosis not present

## 2020-08-09 DIAGNOSIS — M1711 Unilateral primary osteoarthritis, right knee: Secondary | ICD-10-CM | POA: Diagnosis not present

## 2020-08-09 DIAGNOSIS — I10 Essential (primary) hypertension: Secondary | ICD-10-CM | POA: Diagnosis not present

## 2020-08-09 DIAGNOSIS — Z23 Encounter for immunization: Secondary | ICD-10-CM | POA: Diagnosis not present

## 2020-08-09 DIAGNOSIS — M170 Bilateral primary osteoarthritis of knee: Secondary | ICD-10-CM | POA: Diagnosis not present

## 2020-08-09 DIAGNOSIS — M19071 Primary osteoarthritis, right ankle and foot: Secondary | ICD-10-CM | POA: Diagnosis not present

## 2020-08-09 DIAGNOSIS — K219 Gastro-esophageal reflux disease without esophagitis: Secondary | ICD-10-CM | POA: Diagnosis not present

## 2020-08-09 DIAGNOSIS — E790 Hyperuricemia without signs of inflammatory arthritis and tophaceous disease: Secondary | ICD-10-CM | POA: Diagnosis not present

## 2020-08-09 DIAGNOSIS — M19019 Primary osteoarthritis, unspecified shoulder: Secondary | ICD-10-CM | POA: Diagnosis not present

## 2020-08-09 DIAGNOSIS — M47812 Spondylosis without myelopathy or radiculopathy, cervical region: Secondary | ICD-10-CM | POA: Diagnosis not present

## 2020-08-09 DIAGNOSIS — E7800 Pure hypercholesterolemia, unspecified: Secondary | ICD-10-CM | POA: Diagnosis not present

## 2020-08-14 DIAGNOSIS — E7800 Pure hypercholesterolemia, unspecified: Secondary | ICD-10-CM | POA: Diagnosis not present

## 2020-08-14 DIAGNOSIS — M19019 Primary osteoarthritis, unspecified shoulder: Secondary | ICD-10-CM | POA: Diagnosis not present

## 2020-08-14 DIAGNOSIS — Z23 Encounter for immunization: Secondary | ICD-10-CM | POA: Diagnosis not present

## 2020-08-14 DIAGNOSIS — E790 Hyperuricemia without signs of inflammatory arthritis and tophaceous disease: Secondary | ICD-10-CM | POA: Diagnosis not present

## 2020-08-14 DIAGNOSIS — M170 Bilateral primary osteoarthritis of knee: Secondary | ICD-10-CM | POA: Diagnosis not present

## 2020-08-14 DIAGNOSIS — M47812 Spondylosis without myelopathy or radiculopathy, cervical region: Secondary | ICD-10-CM | POA: Diagnosis not present

## 2020-08-14 DIAGNOSIS — M19071 Primary osteoarthritis, right ankle and foot: Secondary | ICD-10-CM | POA: Diagnosis not present

## 2020-08-14 DIAGNOSIS — C649 Malignant neoplasm of unspecified kidney, except renal pelvis: Secondary | ICD-10-CM | POA: Diagnosis not present

## 2020-08-14 DIAGNOSIS — I10 Essential (primary) hypertension: Secondary | ICD-10-CM | POA: Diagnosis not present

## 2020-08-14 DIAGNOSIS — K219 Gastro-esophageal reflux disease without esophagitis: Secondary | ICD-10-CM | POA: Diagnosis not present

## 2020-08-14 DIAGNOSIS — M1711 Unilateral primary osteoarthritis, right knee: Secondary | ICD-10-CM | POA: Diagnosis not present

## 2020-09-12 DIAGNOSIS — M170 Bilateral primary osteoarthritis of knee: Secondary | ICD-10-CM | POA: Diagnosis not present

## 2020-09-12 DIAGNOSIS — E7800 Pure hypercholesterolemia, unspecified: Secondary | ICD-10-CM | POA: Diagnosis not present

## 2020-09-12 DIAGNOSIS — I10 Essential (primary) hypertension: Secondary | ICD-10-CM | POA: Diagnosis not present

## 2020-09-12 DIAGNOSIS — K219 Gastro-esophageal reflux disease without esophagitis: Secondary | ICD-10-CM | POA: Diagnosis not present

## 2020-09-12 DIAGNOSIS — M1711 Unilateral primary osteoarthritis, right knee: Secondary | ICD-10-CM | POA: Diagnosis not present

## 2020-09-12 DIAGNOSIS — M19019 Primary osteoarthritis, unspecified shoulder: Secondary | ICD-10-CM | POA: Diagnosis not present

## 2020-09-12 DIAGNOSIS — M19071 Primary osteoarthritis, right ankle and foot: Secondary | ICD-10-CM | POA: Diagnosis not present

## 2020-09-12 DIAGNOSIS — G8929 Other chronic pain: Secondary | ICD-10-CM | POA: Diagnosis not present

## 2020-09-12 DIAGNOSIS — M47812 Spondylosis without myelopathy or radiculopathy, cervical region: Secondary | ICD-10-CM | POA: Diagnosis not present

## 2020-09-12 DIAGNOSIS — C649 Malignant neoplasm of unspecified kidney, except renal pelvis: Secondary | ICD-10-CM | POA: Diagnosis not present

## 2020-10-31 DIAGNOSIS — L089 Local infection of the skin and subcutaneous tissue, unspecified: Secondary | ICD-10-CM | POA: Diagnosis not present

## 2020-11-15 DIAGNOSIS — H40053 Ocular hypertension, bilateral: Secondary | ICD-10-CM | POA: Diagnosis not present

## 2020-11-15 DIAGNOSIS — H2513 Age-related nuclear cataract, bilateral: Secondary | ICD-10-CM | POA: Diagnosis not present

## 2020-11-15 DIAGNOSIS — H52203 Unspecified astigmatism, bilateral: Secondary | ICD-10-CM | POA: Diagnosis not present

## 2020-11-29 DIAGNOSIS — H2513 Age-related nuclear cataract, bilateral: Secondary | ICD-10-CM | POA: Diagnosis not present

## 2020-11-29 DIAGNOSIS — H40053 Ocular hypertension, bilateral: Secondary | ICD-10-CM | POA: Diagnosis not present

## 2020-12-05 DIAGNOSIS — M170 Bilateral primary osteoarthritis of knee: Secondary | ICD-10-CM | POA: Diagnosis not present

## 2020-12-05 DIAGNOSIS — M19071 Primary osteoarthritis, right ankle and foot: Secondary | ICD-10-CM | POA: Diagnosis not present

## 2020-12-05 DIAGNOSIS — M1711 Unilateral primary osteoarthritis, right knee: Secondary | ICD-10-CM | POA: Diagnosis not present

## 2020-12-05 DIAGNOSIS — M47812 Spondylosis without myelopathy or radiculopathy, cervical region: Secondary | ICD-10-CM | POA: Diagnosis not present

## 2020-12-05 DIAGNOSIS — K219 Gastro-esophageal reflux disease without esophagitis: Secondary | ICD-10-CM | POA: Diagnosis not present

## 2020-12-05 DIAGNOSIS — M19019 Primary osteoarthritis, unspecified shoulder: Secondary | ICD-10-CM | POA: Diagnosis not present

## 2020-12-05 DIAGNOSIS — E7800 Pure hypercholesterolemia, unspecified: Secondary | ICD-10-CM | POA: Diagnosis not present

## 2020-12-05 DIAGNOSIS — I10 Essential (primary) hypertension: Secondary | ICD-10-CM | POA: Diagnosis not present

## 2020-12-05 DIAGNOSIS — G8929 Other chronic pain: Secondary | ICD-10-CM | POA: Diagnosis not present

## 2020-12-12 DIAGNOSIS — H25811 Combined forms of age-related cataract, right eye: Secondary | ICD-10-CM | POA: Diagnosis not present

## 2020-12-12 DIAGNOSIS — H2511 Age-related nuclear cataract, right eye: Secondary | ICD-10-CM | POA: Diagnosis not present

## 2021-01-08 DIAGNOSIS — E7800 Pure hypercholesterolemia, unspecified: Secondary | ICD-10-CM | POA: Diagnosis not present

## 2021-01-08 DIAGNOSIS — K219 Gastro-esophageal reflux disease without esophagitis: Secondary | ICD-10-CM | POA: Diagnosis not present

## 2021-01-08 DIAGNOSIS — E790 Hyperuricemia without signs of inflammatory arthritis and tophaceous disease: Secondary | ICD-10-CM | POA: Diagnosis not present

## 2021-01-08 DIAGNOSIS — R21 Rash and other nonspecific skin eruption: Secondary | ICD-10-CM | POA: Diagnosis not present

## 2021-01-08 DIAGNOSIS — I10 Essential (primary) hypertension: Secondary | ICD-10-CM | POA: Diagnosis not present

## 2021-01-16 DIAGNOSIS — H2512 Age-related nuclear cataract, left eye: Secondary | ICD-10-CM | POA: Diagnosis not present

## 2021-01-16 DIAGNOSIS — H25812 Combined forms of age-related cataract, left eye: Secondary | ICD-10-CM | POA: Diagnosis not present

## 2021-02-07 DIAGNOSIS — K4090 Unilateral inguinal hernia, without obstruction or gangrene, not specified as recurrent: Secondary | ICD-10-CM | POA: Diagnosis not present

## 2021-02-07 DIAGNOSIS — Z0000 Encounter for general adult medical examination without abnormal findings: Secondary | ICD-10-CM | POA: Diagnosis not present

## 2021-02-07 DIAGNOSIS — K219 Gastro-esophageal reflux disease without esophagitis: Secondary | ICD-10-CM | POA: Diagnosis not present

## 2021-02-07 DIAGNOSIS — M25511 Pain in right shoulder: Secondary | ICD-10-CM | POA: Diagnosis not present

## 2021-02-07 DIAGNOSIS — E790 Hyperuricemia without signs of inflammatory arthritis and tophaceous disease: Secondary | ICD-10-CM | POA: Diagnosis not present

## 2021-02-07 DIAGNOSIS — I10 Essential (primary) hypertension: Secondary | ICD-10-CM | POA: Diagnosis not present

## 2021-02-07 DIAGNOSIS — E7800 Pure hypercholesterolemia, unspecified: Secondary | ICD-10-CM | POA: Diagnosis not present

## 2021-02-15 DIAGNOSIS — Z85528 Personal history of other malignant neoplasm of kidney: Secondary | ICD-10-CM | POA: Diagnosis not present

## 2021-02-18 DIAGNOSIS — E041 Nontoxic single thyroid nodule: Secondary | ICD-10-CM | POA: Diagnosis not present

## 2021-02-18 DIAGNOSIS — J8410 Pulmonary fibrosis, unspecified: Secondary | ICD-10-CM | POA: Diagnosis not present

## 2021-02-18 DIAGNOSIS — Z85528 Personal history of other malignant neoplasm of kidney: Secondary | ICD-10-CM | POA: Diagnosis not present

## 2021-02-18 DIAGNOSIS — Z9889 Other specified postprocedural states: Secondary | ICD-10-CM | POA: Diagnosis not present

## 2021-02-18 DIAGNOSIS — K5730 Diverticulosis of large intestine without perforation or abscess without bleeding: Secondary | ICD-10-CM | POA: Diagnosis not present

## 2021-02-19 DIAGNOSIS — E790 Hyperuricemia without signs of inflammatory arthritis and tophaceous disease: Secondary | ICD-10-CM | POA: Diagnosis not present

## 2021-02-19 DIAGNOSIS — I10 Essential (primary) hypertension: Secondary | ICD-10-CM | POA: Diagnosis not present

## 2021-02-19 DIAGNOSIS — K219 Gastro-esophageal reflux disease without esophagitis: Secondary | ICD-10-CM | POA: Diagnosis not present

## 2021-02-19 DIAGNOSIS — E7800 Pure hypercholesterolemia, unspecified: Secondary | ICD-10-CM | POA: Diagnosis not present

## 2021-02-19 DIAGNOSIS — R21 Rash and other nonspecific skin eruption: Secondary | ICD-10-CM | POA: Diagnosis not present

## 2021-02-20 DIAGNOSIS — I10 Essential (primary) hypertension: Secondary | ICD-10-CM | POA: Diagnosis not present

## 2021-02-20 DIAGNOSIS — K219 Gastro-esophageal reflux disease without esophagitis: Secondary | ICD-10-CM | POA: Diagnosis not present

## 2021-02-20 DIAGNOSIS — G8929 Other chronic pain: Secondary | ICD-10-CM | POA: Diagnosis not present

## 2021-02-20 DIAGNOSIS — E7800 Pure hypercholesterolemia, unspecified: Secondary | ICD-10-CM | POA: Diagnosis not present

## 2021-02-20 DIAGNOSIS — M170 Bilateral primary osteoarthritis of knee: Secondary | ICD-10-CM | POA: Diagnosis not present

## 2021-02-22 DIAGNOSIS — D49511 Neoplasm of unspecified behavior of right kidney: Secondary | ICD-10-CM | POA: Diagnosis not present

## 2021-03-21 DIAGNOSIS — K4090 Unilateral inguinal hernia, without obstruction or gangrene, not specified as recurrent: Secondary | ICD-10-CM | POA: Diagnosis not present

## 2021-04-08 DIAGNOSIS — M62830 Muscle spasm of back: Secondary | ICD-10-CM | POA: Diagnosis not present

## 2021-04-15 ENCOUNTER — Other Ambulatory Visit: Payer: Self-pay

## 2021-04-15 ENCOUNTER — Ambulatory Visit (HOSPITAL_BASED_OUTPATIENT_CLINIC_OR_DEPARTMENT_OTHER): Payer: PPO | Attending: Family Medicine | Admitting: Physical Therapy

## 2021-04-15 ENCOUNTER — Encounter (HOSPITAL_BASED_OUTPATIENT_CLINIC_OR_DEPARTMENT_OTHER): Payer: Self-pay | Admitting: Physical Therapy

## 2021-04-15 DIAGNOSIS — M25652 Stiffness of left hip, not elsewhere classified: Secondary | ICD-10-CM | POA: Diagnosis not present

## 2021-04-15 DIAGNOSIS — M545 Low back pain, unspecified: Secondary | ICD-10-CM | POA: Diagnosis not present

## 2021-04-15 DIAGNOSIS — R262 Difficulty in walking, not elsewhere classified: Secondary | ICD-10-CM | POA: Diagnosis not present

## 2021-04-15 DIAGNOSIS — M6281 Muscle weakness (generalized): Secondary | ICD-10-CM | POA: Insufficient documentation

## 2021-04-15 NOTE — Patient Instructions (Signed)
Access Code: [email or web address] URL: [email or web address] Date: 04/15/2021 Prepared by: Daleen Bo  Exercises Lower Trunk Rotations - 2 x daily - 7 x weekly - 2 sets - 10 reps - 3 hold Supine Posterior Pelvic Tilt - 2 x daily - 7 x weekly - 3 sets - 10 reps - 2 hold Seated Pelvic Tilt - 3-4 x daily - 7 x weekly - 2 sets - 10 reps

## 2021-04-15 NOTE — Therapy (Signed)
[facility] [address] [place], [place], [address] Phone: [phone]   Fax:  [phone]  Physical Therapy Evaluation  Patient Details  Name: Bernard Young MRN: [id number] Date of Birth: November 21, 1940 Referring Provider (PT): Rankins, Bill Salinas, MD   Encounter Date: 04/15/2021   PT End of Session - 04/15/21 1632     Visit Number 1    Number of Visits 17    Date for PT Re-Evaluation 07/14/21    Authorization Type [employer]    PT Start Time 9357    PT Stop Time 1615    PT Time Calculation (min) 60 min    Activity Tolerance Patient tolerated treatment well    Behavior During Therapy [facility] for tasks assessed/performed             Past Medical History:  Diagnosis Date   Arthritis    Diverticulosis of colon 05/2017   noted on CT abd/pelvis   Enlarged prostate 06/26/2017   mild , noted on CT abd/pelvis   Headache    Hypertension    Inguinal hernia 05/2017   small to moderate right containing bowel loop of sigmoid colon, noted on CT abd/pelvis   Numbness and tingling in left hand    PONV (postoperative nausea and vomiting)    very very bad nausea but no vomiting : "i get so sick of it and it is just awful ; even with a full stomach i cant vomit "    Renal mass, left    Shoulder pain    right    TIA (transient ischemic attack)    nearly 20 years ago     Past Surgical History:  Procedure Laterality Date   JOINT REPLACEMENT Left    left knee arthoscopy   2001   ROBOT ASSISTED LAPAROSCOPIC NEPHRECTOMY Left 10/29/2017   Procedure: XI ROBOTIC ASSISTED LAPAROSCOPIC / PARTIAL NEPHRECTOMY;  Surgeon: Raynelle Bring, MD;  Location: [facility];  Service: Urology;  Laterality: Left;  NEEDS 210 MIN    There were no vitals filed for this visit.    Subjective Assessment - 04/15/21 1529     Subjective Pt states that the back pain started happening about 4 weeks ago. He tried squatting one day with support and the pain  started around then. He also tried walking with heel toe walking and possibly could have caused it too. Pt report the pain is strong dull feeling. Pt has L THA and reports surgeon has made him maintain posterior hip precautions despite 2016 surgery. It feels like someone pressing with hard pressure. Pt tries to change his posture throughout the day and it correlated with the pain. He slowed down working out since March eye surgery. He uses some 2-3lb weights for some upper body exercise. He walks daily ~4k steps a day religiously. Pt locates pain into the the R side of the hip "up high" and will feel it into both thighs. It will be across the low back as well. Pt states he sleeps in a recliner due to his shoulder pain. Aggs: first thing waking up in the morning, twisting lifting, change in weather; Eases: medication, heat. Current 0/10, Worst 9/10. Pt denies cancer red flags. Pt denies fever, nausea, vomitting. Pt denies drop attacks. Pt denies NT. Pt does have hernia surgery soon.    Pertinent History TIA, OA, kidney cancer, L THA 2016, L hernia, R knee pain, R shoulder pain    How long can you sit comfortably? 1 hr  Diagnostic tests N/A    Patient Stated Goals Pt would like to improve posture and improve his back pain so he can walk upright in the future.    Currently in Pain? No/denies    Multiple Pain Sites No                [facility] PT Assessment - 04/15/21 0001       Assessment   Medical Diagnosis M62.830 (ICD-10-CM) - Muscle spasm of back    Referring Provider (PT) Rankins, Bill Salinas, MD    Prior Therapy Knee and gait      Precautions   Precautions None      Restrictions   Weight Bearing Restrictions No      Balance Screen   Has the patient fallen in the past 6 months No      [place] residence    Living Arrangements Spouse/significant other    Type of [place]      Prior Function   Level of Independence Independent      Cognition    Overall Cognitive Status Within Functional Limits for tasks assessed      Observation/Other Assessments   Other Surveys  Oswestry Disability Index    Oswestry Disability Index  10/50 20%      Functional Tests   Functional tests Sit to Stand;Squat      Squat   Comments decreased depth, flexed trunk position      Sit to Stand   Comments flexed trunk, UE support needed, decreased hip extension      Posture/Postural Control   Posture/Postural Control Postural limitations    Postural Limitations Increased thoracic kyphosis;Posterior pelvic tilt;Decreased lumbar lordosis;Flexed trunk      ROM / Strength   AROM / PROM / Strength AROM;PROM;Strength      AROM   Overall AROM Comments L/S significantly limited. flexion 50%, ext 0%, bilat SB 40%, bilat rotation 50%      PROM   Overall PROM Comments L hip flexion 90, IR 10, ER 30; R hip flexion 110, IR 20, ER 30; limited hip ext bilat   pt apprehensive with hip flexion beyond 90     Strength   Overall Strength Comments R hip 4+/5 throughout, L hip 4/5      Flexibility   Soft Tissue Assessment /Muscle Length yes    Hamstrings limited in supine 90/90    Quadriceps rec fem and hip flexor length restriction    Quadratus Lumborum L side taut to palpation      Palpation   Spinal mobility decreased extension glide globally with CPA    Palpation comment hypertonicity of bilateral L/S paraspinals, QL, and hip rotators      Transfers   Five time sit to stand comments  unable to perform from regular height chair without UE      Ambulation/Gait   Ambulation Distance (Feet) 40 Feet    Gait Pattern Decreased stride length;Decreased hip/knee flexion - right;Decreased hip/knee flexion - left;Trunk flexed;Narrow base of support                        Objective measurements completed on examination: See above findings.       [facility] Adult PT Treatment/Exercise - 04/15/21 0001       Exercises   Exercises Lumbar      Lumbar  Exercises: Stretches   Lower Trunk Rotation Limitations 10x 3s    Pelvic Tilt Limitations  15x 2s      Lumbar Exercises: Seated   Other Seated Lumbar Exercises seated pelvic tilt A&P 20x                    PT Education - 04/15/21 1554     Education Details MOI, diagnosis, prognosis, anatomy, exercise progression, DOMS expectations, muscle firing, HEP, joint protection, postural changes, POC    Person(s) Educated Patient    Methods Explanation;Demonstration;Tactile cues;Verbal cues;Handout    Comprehension Verbalized understanding;Returned demonstration;Verbal cues required;Tactile cues required              PT Short Term Goals - 04/15/21 1647       PT SHORT TERM GOAL #1   Title Pt will become independent with HEP in order to demonstrate synthesis of PT education.    Time 2    Period Weeks    Status New      PT SHORT TERM GOAL #2   Title Pt will be able to demonstrate ability to squat to pick up item from floor in order to demonstrate functional improvement in L/S function for self-care and house hold duties.    Time 4    Period Weeks    Status New      PT SHORT TERM GOAL #3   Title Pt will demonstrate/report ability to stand/sit/sleep without pain in order to demonstrate functional improvement and tolerance to static positioning.    Baseline 4    Period Weeks    Status New               PT Long Term Goals - 04/15/21 1650       PT LONG TERM GOAL #1   Title Pt will become independent with final HEP in order to demonstrate synthesis of PT education.    Time 8    Period Weeks    Status New      PT LONG TERM GOAL #2   Title Pt will demonstrate at least a 12.8 improvement in Oswestry Index in order to demonstrate a clinically significant change in LBP and function.    Time 8    Period Weeks    Status New      PT LONG TERM GOAL #3   Title Pt will be able to perform 5XSTS in under 12  in order to demonstrate functional improvement above the cut off  score for older adults.    Time 8    Period Weeks    Status New                    Plan - 04/15/21 1633     Clinical Impression Statement Pt is an 80 y.o. male presenting to PT eval for CC of LBP and L hip pain. Pt has extensive medical history including previous L THA and L hernia that could be contributing to current pain. Pt presents with signficant hip and L/S ROM stiffness, lumbopelvic and L hip weakness, and decreased funcitonal mobility. Pt's s/s appear consistent with non-specific LBP likely due to chronic static positioning and ROM restriction. Pt's pain is moderately sensitive and irritable. Pt's posture is highly flexed globally and has soft tissue restrictions across both anterior and posterior chain. Pt reported improvement and relief with pelvic and gentle L/S stretching. Pt's impairments limit participation with ADL, exercise, and recreation.  Pt would benefit from continued skilled therapy in order to reach goals and maximize functional lumbopelvic strength and ROM for prevention of further functional decline.  Personal Factors and Comorbidities Age;Fitness;Past/Current Experience;Comorbidity 1;Comorbidity 2;Comorbidity 3+    Examination-Activity Limitations Transfers;Stand;Lift;Squat;Sit;Carry;Bend    Examination-Participation Restrictions Interpersonal Relationship;Shop;Other;Community Activity    Stability/Clinical Decision Making Evolving/Moderate complexity    Clinical Decision Making Moderate    Rehab Potential Good    PT Frequency 2x / week    PT Duration 8 weeks    PT Treatment/Interventions ADLs/Self Care Home Management;Aquatic Therapy;Cryotherapy;Electrical Stimulation;Iontophoresis 4mg /ml Dexamethasone;Moist Heat;Traction;Ultrasound;Gait training;Stair training;Functional mobility training;Therapeutic activities;Therapeutic exercise;Balance training;Neuromuscular re-education;Patient/family education;Orthotic Fit/Training;Manual techniques;Passive range of  motion;Scar mobilization;Dry needling;Taping;Vasopneumatic Device;Joint Manipulations;Spinal Manipulations    PT Next Visit Plan review HEP, ROM as tolerated    PT Home Exercise Plan Access Code [email or web address]    Consulted and Agree with Plan of Care Patient             Patient will benefit from skilled therapeutic intervention in order to improve the following deficits and impairments:  Abnormal gait, Increased fascial restricitons, Pain, Improper body mechanics, Postural dysfunction, Decreased mobility, Increased muscle spasms, Decreased activity tolerance, Decreased range of motion, Decreased strength, Hypomobility, Difficulty walking, Impaired flexibility, Decreased balance  Visit Diagnosis: Pain, lumbar region - Plan: PT plan of care cert/re-cert  Stiffness of left hip, not elsewhere classified - Plan: PT plan of care cert/re-cert  Muscle weakness (generalized) - Plan: PT plan of care cert/re-cert  Difficulty walking - Plan: PT plan of care cert/re-cert     Problem List Patient Active Problem List   Diagnosis Date Noted   Neoplasm of left kidney 10/29/2017   Daleen Bo PT, DPT 04/15/21 4:57 PM  [facility] [address] [place], [place], [address] Phone: [phone]   Fax:  [phone]  Name: Kiron Osmun MRN: [id number] Date of Birth: [birth year]

## 2021-04-16 DIAGNOSIS — B351 Tinea unguium: Secondary | ICD-10-CM | POA: Diagnosis not present

## 2021-04-16 DIAGNOSIS — L600 Ingrowing nail: Secondary | ICD-10-CM | POA: Diagnosis not present

## 2021-04-18 ENCOUNTER — Ambulatory Visit (HOSPITAL_BASED_OUTPATIENT_CLINIC_OR_DEPARTMENT_OTHER): Payer: PPO | Admitting: Physical Therapy

## 2021-04-18 ENCOUNTER — Encounter (HOSPITAL_BASED_OUTPATIENT_CLINIC_OR_DEPARTMENT_OTHER): Payer: Self-pay | Admitting: Physical Therapy

## 2021-04-18 ENCOUNTER — Other Ambulatory Visit: Payer: Self-pay

## 2021-04-18 DIAGNOSIS — M545 Low back pain, unspecified: Secondary | ICD-10-CM

## 2021-04-18 DIAGNOSIS — M25652 Stiffness of left hip, not elsewhere classified: Secondary | ICD-10-CM

## 2021-04-18 DIAGNOSIS — M6281 Muscle weakness (generalized): Secondary | ICD-10-CM

## 2021-04-18 DIAGNOSIS — R262 Difficulty in walking, not elsewhere classified: Secondary | ICD-10-CM

## 2021-04-18 NOTE — Therapy (Signed)
[facility] [address] [place], [place], [address] Phone: [phone]   Fax:  [phone]  Physical Therapy Treatment  Patient Details  Name: Bernard Young MRN: [id number] Date of Birth: [birth year] Referring Provider (PT): Rankins, Bill Salinas, MD   Encounter Date: 04/18/2021   PT End of Session - 04/18/21 1544     Visit Number 2    Number of Visits 17    Date for PT Re-Evaluation 07/14/21    Authorization Type [employer]    PT Start Time 3016    PT Stop Time 1555    PT Time Calculation (min) 40 min    Activity Tolerance Patient tolerated treatment well    Behavior During Therapy [facility] for tasks assessed/performed             Past Medical History:  Diagnosis Date   Arthritis    Diverticulosis of colon 05/2017   noted on CT abd/pelvis   Enlarged prostate 06/26/2017   mild , noted on CT abd/pelvis   Headache    Hypertension    Inguinal hernia 05/2017   small to moderate right containing bowel loop of sigmoid colon, noted on CT abd/pelvis   Numbness and tingling in left hand    PONV (postoperative nausea and vomiting)    very very bad nausea but no vomiting : "i get so sick of it and it is just awful ; even with a full stomach i cant vomit "    Renal mass, left    Shoulder pain    right    TIA (transient ischemic attack)    nearly 20 years ago     Past Surgical History:  Procedure Laterality Date   JOINT REPLACEMENT Left    left knee arthoscopy   2001   ROBOT ASSISTED LAPAROSCOPIC NEPHRECTOMY Left 10/29/2017   Procedure: XI ROBOTIC ASSISTED LAPAROSCOPIC / PARTIAL NEPHRECTOMY;  Surgeon: Raynelle Bring, MD;  Location: [facility];  Service: Urology;  Laterality: Left;  NEEDS 210 MIN    There were no vitals filed for this visit.   Subjective Assessment - 04/18/21 1518     Subjective Pt reports mixed compliance with HEP. He has pain into both hips today.    Pertinent History TIA, OA, kidney cancer, L  THA 2016, L hernia, R knee pain, R shoulder pain    How long can you sit comfortably? 1 hr    Diagnostic tests N/A    Patient Stated Goals Pt would like to improve posture and improve his back pain so he can walk upright in the future.    Currently in Pain? Yes    Pain Score 5     Pain Location Hip    Pain Orientation Left;Right    Pain Descriptors / Indicators Aching;Sore                               [facility] Adult PT Treatment/Exercise - 04/18/21 0001       Transfers   Five time sit to stand comments  unable to perform from regular height chair without UE      Ambulation/Gait   Ambulation Distance (Feet) 40 Feet    Gait Pattern Decreased stride length;Decreased hip/knee flexion - right;Decreased hip/knee flexion - left;Trunk flexed;Narrow base of support      Posture/Postural Control   Posture/Postural Control Postural limitations    Postural Limitations Increased thoracic kyphosis;Posterior pelvic tilt;Decreased lumbar lordosis;Flexed trunk  Exercises   Exercises Lumbar      Lumbar Exercises: Stretches   Lower Trunk Rotation Limitations 10x 3s    Pelvic Tilt Limitations 15x 2s    Other Lumbar Stretch Exercise QL seated 30s 3x      Lumbar Exercises: Seated   Other Seated Lumbar Exercises seated pelvic tilt A&P 20x      Lumbar Exercises: Supine   Bridge Limitations 2x10           Manual therapy: seated L1-5 CPA grade II, bilateral paraspinal STM  Unable to tolerate sidelying on table at start of session          PT Short Term Goals - 04/15/21 1647       PT SHORT TERM GOAL #1   Title Pt will become independent with HEP in order to demonstrate synthesis of PT education.    Time 2    Period Weeks    Status New      PT SHORT TERM GOAL #2   Title Pt will be able to demonstrate ability to squat to pick up item from floor in order to demonstrate functional improvement in L/S function for self-care and house hold duties.    Time 4     Period Weeks    Status New      PT SHORT TERM GOAL #3   Title Pt will demonstrate/report ability to stand/sit/sleep without pain in order to demonstrate functional improvement and tolerance to static positioning.    Baseline 4    Period Weeks    Status New               PT Long Term Goals - 04/15/21 1650       PT LONG TERM GOAL #1   Title Pt will become independent with final HEP in order to demonstrate synthesis of PT education.    Time 8    Period Weeks    Status New      PT LONG TERM GOAL #2   Title Pt will demonstrate at least a 12.8 improvement in Oswestry Index in order to demonstrate a clinically significant change in LBP and function.    Time 8    Period Weeks    Status New      PT LONG TERM GOAL #3   Title Pt will be able to perform 5XSTS in under 12  in order to demonstrate functional improvement above the cut off score for older adults.    Time 8    Period Weeks    Status New                   Plan - 04/18/21 1545     Clinical Impression Statement Pt unable to tolerate S/L during today's session for manual therapy and exercise. Manual perform in seated and seated pelvic tilting had to performed to decrease stiffness and pain before transitioning to supine on table. Pt is still greatly limited by stiffness of his L/S and hips. Pt able to progress to bridges today but requires signficant VC and TC for pelvic postion and spinal alignment and hip extension. Pt had decreased pain as session continued and he began to increase L/S ROM and muscle activation. Pt would benefit from continued skilled therapy in order to reach goals and maximize functional lumbopelvic strength and ROM for prevention of further functional decline.    Personal Factors and Comorbidities Age;Fitness;Past/Current Experience;Comorbidity 1;Comorbidity 2;Comorbidity 3+    Examination-Activity Limitations Transfers;Stand;Lift;Squat;Sit;Carry;Bend    Examination-Participation Restrictions  Interpersonal Relationship;Shop;Other;Community Activity    Stability/Clinical Decision Making Evolving/Moderate complexity    Rehab Potential Good    PT Frequency 2x / week    PT Duration 8 weeks    PT Treatment/Interventions ADLs/Self Care Home Management;Aquatic Therapy;Cryotherapy;Electrical Stimulation;Iontophoresis 4mg /ml Dexamethasone;Moist Heat;Traction;Ultrasound;Gait training;Stair training;Functional mobility training;Therapeutic activities;Therapeutic exercise;Balance training;Neuromuscular re-education;Patient/family education;Orthotic Fit/Training;Manual techniques;Passive range of motion;Scar mobilization;Dry needling;Taping;Vasopneumatic Device;Joint Manipulations;Spinal Manipulations    PT Next Visit Plan review HEP, ROM as tolerated    PT Home Exercise Plan Access Code [email or web address]    Consulted and Agree with Plan of Care Patient             Patient will benefit from skilled therapeutic intervention in order to improve the following deficits and impairments:  Abnormal gait, Increased fascial restricitons, Pain, Improper body mechanics, Postural dysfunction, Decreased mobility, Increased muscle spasms, Decreased activity tolerance, Decreased range of motion, Decreased strength, Hypomobility, Difficulty walking, Impaired flexibility, Decreased balance  Visit Diagnosis: Pain, lumbar region  Stiffness of left hip, not elsewhere classified  Muscle weakness (generalized)  Difficulty walking     Problem List Patient Active Problem List   Diagnosis Date Noted   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

## 2021-04-29 ENCOUNTER — Other Ambulatory Visit: Payer: Self-pay

## 2021-04-29 ENCOUNTER — Ambulatory Visit (HOSPITAL_BASED_OUTPATIENT_CLINIC_OR_DEPARTMENT_OTHER): Payer: PPO | Attending: Family Medicine | Admitting: Physical Therapy

## 2021-04-29 ENCOUNTER — Encounter (HOSPITAL_BASED_OUTPATIENT_CLINIC_OR_DEPARTMENT_OTHER): Payer: Self-pay | Admitting: Physical Therapy

## 2021-04-29 DIAGNOSIS — M6281 Muscle weakness (generalized): Secondary | ICD-10-CM | POA: Diagnosis not present

## 2021-04-29 DIAGNOSIS — M545 Low back pain, unspecified: Secondary | ICD-10-CM | POA: Insufficient documentation

## 2021-04-29 DIAGNOSIS — M25652 Stiffness of left hip, not elsewhere classified: Secondary | ICD-10-CM | POA: Diagnosis not present

## 2021-04-29 DIAGNOSIS — R262 Difficulty in walking, not elsewhere classified: Secondary | ICD-10-CM | POA: Insufficient documentation

## 2021-04-29 NOTE — Patient Instructions (Signed)
Access Code: [email or web address] URL: [email or web address] Date: 04/29/2021 Prepared by: Daleen Bo  Exercises Lower Trunk Rotations - 2 x daily - 7 x weekly - 2 sets - 10 reps - 3 hold Supine Posterior Pelvic Tilt - 2 x daily - 7 x weekly - 3 sets - 10 reps - 2 hold Seated Pelvic Tilt - 3-4 x daily - 7 x weekly - 2 sets - 10 reps Seated Scapular Retraction - 3-4 x daily - 7 x weekly - 2 sets - 10 reps

## 2021-04-29 NOTE — Therapy (Signed)
[facility] [address] [place], [place], [address] Phone: [phone]   Fax:  [phone]  Physical Therapy Treatment  Patient Details  Name: Bernard Young MRN: [id number] Date of Birth: [birth year] Referring Provider (PT): Rankins, Bill Salinas, MD   Encounter Date: 04/29/2021   PT End of Session - 04/29/21 1553     Visit Number 3    Number of Visits 17    Date for PT Re-Evaluation 07/14/21    Authorization Type [employer]    PT Start Time 1515    PT Stop Time 1600    PT Time Calculation (min) 45 min    Activity Tolerance Patient tolerated treatment well    Behavior During Therapy [facility] for tasks assessed/performed             Past Medical History:  Diagnosis Date   Arthritis    Diverticulosis of colon 05/2017   noted on CT abd/pelvis   Enlarged prostate 06/26/2017   mild , noted on CT abd/pelvis   Headache    Hypertension    Inguinal hernia 05/2017   small to moderate right containing bowel loop of sigmoid colon, noted on CT abd/pelvis   Numbness and tingling in left hand    PONV (postoperative nausea and vomiting)    very very bad nausea but no vomiting : "i get so sick of it and it is just awful ; even with a full stomach i cant vomit "    Renal mass, left    Shoulder pain    right    TIA (transient ischemic attack)    nearly 20 years ago     Past Surgical History:  Procedure Laterality Date   JOINT REPLACEMENT Left    left knee arthoscopy   2001   ROBOT ASSISTED LAPAROSCOPIC NEPHRECTOMY Left 10/29/2017   Procedure: XI ROBOTIC ASSISTED LAPAROSCOPIC / PARTIAL NEPHRECTOMY;  Surgeon: Raynelle Bring, MD;  Location: [facility];  Service: Urology;  Laterality: Left;  NEEDS 210 MIN    There were no vitals filed for this visit.   Subjective Assessment - 04/29/21 1522     Subjective Pt states that he has had more pain recently. He think it might be related to the weather/rain. He reports feeling better  after last session.    Pertinent History TIA, OA, kidney cancer, L THA 2016, L hernia, R knee pain, R shoulder pain    How long can you sit comfortably? 1 hr    Diagnostic tests N/A    Patient Stated Goals Pt would like to improve posture and improve his back pain so he can walk upright in the future.    Currently in Pain? Yes    Pain Score 4     Pain Location Hip    Pain Orientation Left;Right    Pain Descriptors / Indicators Aching;Sore                               [facility] Adult PT Treatment/Exercise - 04/29/21 0001                       Gait Pattern Decreased stride length;Decreased hip/knee flexion - right;Decreased hip/knee flexion - left;Trunk flexed;Narrow base of support      Posture/Postural Control   Posture/Postural Control Postural limitations    Postural Limitations Increased thoracic kyphosis;Posterior pelvic tilt;Decreased lumbar lordosis;Flexed trunk      Exercises   Exercises  Lumbar      Lumbar Exercises: Stretches           Other Lumbar Stretch Exercise QL seated 30s 3x; seated lumbar flexion stretch 10s 10x    Other Lumbar Stretch Exercise standing hip flexor stretch 30s 3x      Lumbar Exercises: Aerobic   Nustep L2 7 min      Lumbar Exercises: Standing   Other Standing Lumbar Exercises standing L/S ext at wall  10x 2s      Lumbar Exercises: Seated   Other Seated Lumbar Exercises seated pelvic tilt A&P 20x    Other Seated Lumbar Exercises scapular squeeze 2x10               Manual Therapy   Manual Therapy Soft tissue mobilization;Joint mobilization    Joint Mobilization seated L1-5 CPA grade II    Soft tissue mobilization bilateral paraspinal STM                    PT Education - 04/29/21 1637     Education Details anatomy, exercise progression, daily movement variation, muscle firing, HEP, joint protection, postural changes, acceptable levels of pain    Person(s) Educated Patient    Methods  Explanation;Demonstration;Tactile cues;Verbal cues;Handout    Comprehension Verbalized understanding;Returned demonstration;Verbal cues required;Tactile cues required              PT Short Term Goals - 04/15/21 1647       PT SHORT TERM GOAL #1   Title Pt will become independent with HEP in order to demonstrate synthesis of PT education.    Time 2    Period Weeks    Status New      PT SHORT TERM GOAL #2   Title Pt will be able to demonstrate ability to squat to pick up item from floor in order to demonstrate functional improvement in L/S function for self-care and house hold duties.    Time 4    Period Weeks    Status New      PT SHORT TERM GOAL #3   Title Pt will demonstrate/report ability to stand/sit/sleep without pain in order to demonstrate functional improvement and tolerance to static positioning.    Baseline 4    Period Weeks    Status New               PT Long Term Goals - 04/15/21 1650       PT LONG TERM GOAL #1   Title Pt will become independent with final HEP in order to demonstrate synthesis of PT education.    Time 8    Period Weeks    Status New      PT LONG TERM GOAL #2   Title Pt will demonstrate at least a 12.8 improvement in Oswestry Index in order to demonstrate a clinically significant change in LBP and function.    Time 8    Period Weeks    Status New      PT LONG TERM GOAL #3   Title Pt will be able to perform 5XSTS in under 12  in order to demonstrate functional improvement above the cut off score for older adults.    Time 8    Period Weeks    Status New                   Plan - 04/29/21 1554     Clinical Impression Statement Pt responded well to manual therapy with improved  ROM and soft tissue extensibilty at beginning of session. Pt HEP updated to remove supine position exercise due to exacerbation of pain. Pt was able to tolerate postural correction exercises and anterior thigh/hip flexor stretching without increased  pain. However, pt's R shoulder significantly limits his ability to perform transfers and transitional/UE WB movements. Pt did have improved upright posture and decreased anterior thigh pain after session. Plan to focus on postural variance and lumbopelvic ROM as tolerated. Pt would benefit from continued skilled therapy in order to reach goals and maximize functional lumbopelvic strength and ROM for prevention of further functional decline.    Personal Factors and Comorbidities Age;Fitness;Past/Current Experience;Comorbidity 1;Comorbidity 2;Comorbidity 3+    Examination-Activity Limitations Transfers;Stand;Lift;Squat;Sit;Carry;Bend    Examination-Participation Restrictions Interpersonal Relationship;Shop;Other;Community Activity    Stability/Clinical Decision Making Evolving/Moderate complexity    Rehab Potential Good    PT Frequency 2x / week    PT Duration 8 weeks    PT Treatment/Interventions ADLs/Self Care Home Management;Aquatic Therapy;Cryotherapy;Electrical Stimulation;Iontophoresis '4mg'$ /ml Dexamethasone;Moist Heat;Traction;Ultrasound;Gait training;Stair training;Functional mobility training;Therapeutic activities;Therapeutic exercise;Balance training;Neuromuscular re-education;Patient/family education;Orthotic Fit/Training;Manual techniques;Passive range of motion;Scar mobilization;Dry needling;Taping;Vasopneumatic Device;Joint Manipulations;Spinal Manipulations    PT Next Visit Plan review HEP, ROM as tolerated    PT Home Exercise Plan Access Code [email or web address]    Consulted and Agree with Plan of Care Patient             Patient will benefit from skilled therapeutic intervention in order to improve the following deficits and impairments:  Abnormal gait, Increased fascial restricitons, Pain, Improper body mechanics, Postural dysfunction, Decreased mobility, Increased muscle spasms, Decreased activity tolerance, Decreased range of motion, Decreased strength, Hypomobility, Difficulty walking,  Impaired flexibility, Decreased balance  Visit Diagnosis: Pain, lumbar region  Stiffness of left hip, not elsewhere classified  Muscle weakness (generalized)  Difficulty walking     Problem List Patient Active Problem List   Diagnosis Date Noted   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

## 2021-05-02 ENCOUNTER — Ambulatory Visit (HOSPITAL_BASED_OUTPATIENT_CLINIC_OR_DEPARTMENT_OTHER): Payer: PPO | Admitting: Physical Therapy

## 2021-05-02 ENCOUNTER — Other Ambulatory Visit: Payer: Self-pay

## 2021-05-02 ENCOUNTER — Encounter (HOSPITAL_BASED_OUTPATIENT_CLINIC_OR_DEPARTMENT_OTHER): Payer: Self-pay | Admitting: Physical Therapy

## 2021-05-02 DIAGNOSIS — M25652 Stiffness of left hip, not elsewhere classified: Secondary | ICD-10-CM

## 2021-05-02 DIAGNOSIS — M545 Low back pain, unspecified: Secondary | ICD-10-CM | POA: Diagnosis not present

## 2021-05-02 DIAGNOSIS — R262 Difficulty in walking, not elsewhere classified: Secondary | ICD-10-CM

## 2021-05-02 DIAGNOSIS — M6281 Muscle weakness (generalized): Secondary | ICD-10-CM

## 2021-05-02 NOTE — Therapy (Addendum)
[facility] [address] [place], [place], [address] Phone: [phone]   Fax:  [phone]  Physical Therapy Treatment  Patient Details  Name: Bernard Young MRN: [id number] Date of Birth: 1940-11-14 Referring Provider (PT): Rankins, Bill Salinas, MD   Encounter Date: 05/02/2021   PT End of Session - 05/02/21 1605     Visit Number 4    Number of Visits 17    Date for PT Re-Evaluation 07/14/21    Authorization Type [employer]    PT Start Time [id number]    PT Stop Time 1555    PT Time Calculation (min) 40 min    Activity Tolerance Patient tolerated treatment well    Behavior During Therapy [facility] for tasks assessed/performed             Past Medical History:  Diagnosis Date   Arthritis    Diverticulosis of colon 05/2017   noted on CT abd/pelvis   Enlarged prostate 06/26/2017   mild , noted on CT abd/pelvis   Headache    Hypertension    Inguinal hernia 05/2017   small to moderate right containing bowel loop of sigmoid colon, noted on CT abd/pelvis   Numbness and tingling in left hand    PONV (postoperative nausea and vomiting)    very very bad nausea but no vomiting : "i get so sick of it and it is just awful ; even with a full stomach i cant vomit "    Renal mass, left    Shoulder pain    right    TIA (transient ischemic attack)    nearly 20 years ago     Past Surgical History:  Procedure Laterality Date   JOINT REPLACEMENT Left    left knee arthoscopy   2001   ROBOT ASSISTED LAPAROSCOPIC NEPHRECTOMY Left 10/29/2017   Procedure: XI ROBOTIC ASSISTED LAPAROSCOPIC / PARTIAL NEPHRECTOMY;  Surgeon: Raynelle Bring, MD;  Location: [facility];  Service: Urology;  Laterality: Left;  NEEDS 210 MIN    There were no vitals filed for this visit.   Subjective Assessment - 05/02/21 1524     Subjective Pt states he is having less pain today. He feels like the PT is beginning to work.    Pertinent History TIA, OA, kidney  cancer, L THA 2016, L hernia, R knee pain, R shoulder pain    How long can you sit comfortably? 1 hr    Diagnostic tests N/A    Patient Stated Goals Pt would like to improve posture and improve his back pain so he can walk upright in the future.    Currently in Pain? Yes    Pain Score 3     Pain Location Back    Pain Orientation Lower                  [facility] Adult PT Treatment/Exercise - 05/02/21 0001       Ambulation/Gait   Ambulation Distance (Feet) 90 Feet    Gait Pattern Decreased stride length;Decreased hip/knee flexion - right;Decreased hip/knee flexion - left;Trunk flexed;     Posture/Postural Control   Posture/Postural Control Postural limitations    Postural Limitations Increased thoracic kyphosis;Posterior pelvic tilt;Decreased lumbar lordosis;Flexed trunk      Exercises   Exercises Lumbar      Lumbar Exercises: Stretches   Other Lumbar Stretch Exercise QL seated 30s 3x; seated lumbar flexion stretch 10s 10x    Other Lumbar Stretch Exercise standing hip flexor stretch 30s 3x  Lumbar Exercises: Aerobic   Nustep L3 7 min      Lumbar Exercises: Standing   Other Standing Lumbar Exercises standing L/S ext at wall  10x 2s      Lumbar Exercises: Seated       Other Seated Lumbar Exercises standing row GTB 2x10      Manual Therapy   Manual Therapy Soft tissue mobilization;Joint mobilization    Joint Mobilization seated L1-5 CPA grade II    Soft tissue mobilization bilateral paraspinal STM                    PT Short Term Goals - 04/15/21 1647       PT SHORT TERM GOAL #1   Title Pt will become independent with HEP in order to demonstrate synthesis of PT education.    Time 2    Period Weeks    Status New      PT SHORT TERM GOAL #2   Title Pt will be able to demonstrate ability to squat to pick up item from floor in order to demonstrate functional improvement in L/S function for self-care and house hold duties.    Time 4    Period Weeks     Status New      PT SHORT TERM GOAL #3   Title Pt will demonstrate/report ability to stand/sit/sleep without pain in order to demonstrate functional improvement and tolerance to static positioning.    Baseline 4    Period Weeks    Status New               PT Long Term Goals - 04/15/21 1650       PT LONG TERM GOAL #1   Title Pt will become independent with final HEP in order to demonstrate synthesis of PT education.    Time 8    Period Weeks    Status New      PT LONG TERM GOAL #2   Title Pt will demonstrate at least a 12.8 improvement in Oswestry Index in order to demonstrate a clinically significant change in LBP and function.    Time 8    Period Weeks    Status New      PT LONG TERM GOAL #3   Title Pt will be able to perform 5XSTS in under 12  in order to demonstrate functional improvement above the cut off score for older adults.    Time 8    Period Weeks    Status New                   Plan - 05/02/21 1546     Clinical Impression Statement Pt had good response to manual therapy and exercise at today's session with improved lumbar mobility and posture following session. Pt does report improvement in stiffness though pain is roughly similar. Pt demonstrates good improvement in quality of movement with transfers and ease of gait as compared to last session. Continue to focus on postural variance and lumbopelvic ROM as tolerated. Pt to trial aquatic therapy to due to land based deficits. Pt would benefit from continued skilled therapy in order to reach goals and maximize functional lumbopelvic strength and ROM for prevention of further functional decline.    Personal Factors and Comorbidities Age;Fitness;Past/Current Experience;Comorbidity 1;Comorbidity 2;Comorbidity 3+    Examination-Activity Limitations Transfers;Stand;Lift;Squat;Sit;Carry;Bend    Examination-Participation Restrictions Interpersonal Relationship;Shop;Other;Community Activity    Stability/Clinical  Decision Making Evolving/Moderate complexity    Rehab Potential Good  PT Frequency 2x / week    PT Duration 8 weeks    PT Treatment/Interventions ADLs/Self Care Home Management;Aquatic Therapy;Cryotherapy;Electrical Stimulation;Iontophoresis '4mg'$ /ml Dexamethasone;Moist Heat;Traction;Ultrasound;Gait training;Stair training;Functional mobility training;Therapeutic activities;Therapeutic exercise;Balance training;Neuromuscular re-education;Patient/family education;Orthotic Fit/Training;Manual techniques;Passive range of motion;Scar mobilization;Dry needling;Taping;Vasopneumatic Device;Joint Manipulations;Spinal Manipulations    PT Next Visit Plan review HEP, ROM as tolerated    PT Home Exercise Plan Access Code [email or web address]    Consulted and Agree with Plan of Care Patient             Patient will benefit from skilled therapeutic intervention in order to improve the following deficits and impairments:  Abnormal gait, Increased fascial restricitons, Pain, Improper body mechanics, Postural dysfunction, Decreased mobility, Increased muscle spasms, Decreased activity tolerance, Decreased range of motion, Decreased strength, Hypomobility, Difficulty walking, Impaired flexibility, Decreased balance  Visit Diagnosis: Pain, lumbar region  Stiffness of left hip, not elsewhere classified  Muscle weakness (generalized)  Difficulty walking     Problem List Patient Active Problem List   Diagnosis Date Noted   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

## 2021-05-06 ENCOUNTER — Encounter (HOSPITAL_BASED_OUTPATIENT_CLINIC_OR_DEPARTMENT_OTHER): Payer: Self-pay | Admitting: Physical Therapy

## 2021-05-06 ENCOUNTER — Other Ambulatory Visit: Payer: Self-pay

## 2021-05-06 ENCOUNTER — Ambulatory Visit (HOSPITAL_BASED_OUTPATIENT_CLINIC_OR_DEPARTMENT_OTHER): Payer: PPO | Admitting: Physical Therapy

## 2021-05-06 DIAGNOSIS — M545 Low back pain, unspecified: Secondary | ICD-10-CM | POA: Diagnosis not present

## 2021-05-06 DIAGNOSIS — R262 Difficulty in walking, not elsewhere classified: Secondary | ICD-10-CM

## 2021-05-06 DIAGNOSIS — M25652 Stiffness of left hip, not elsewhere classified: Secondary | ICD-10-CM

## 2021-05-06 DIAGNOSIS — M6281 Muscle weakness (generalized): Secondary | ICD-10-CM

## 2021-05-07 DIAGNOSIS — G8929 Other chronic pain: Secondary | ICD-10-CM | POA: Diagnosis not present

## 2021-05-07 DIAGNOSIS — E7800 Pure hypercholesterolemia, unspecified: Secondary | ICD-10-CM | POA: Diagnosis not present

## 2021-05-07 DIAGNOSIS — M19071 Primary osteoarthritis, right ankle and foot: Secondary | ICD-10-CM | POA: Diagnosis not present

## 2021-05-07 DIAGNOSIS — C649 Malignant neoplasm of unspecified kidney, except renal pelvis: Secondary | ICD-10-CM | POA: Diagnosis not present

## 2021-05-07 DIAGNOSIS — I10 Essential (primary) hypertension: Secondary | ICD-10-CM | POA: Diagnosis not present

## 2021-05-07 DIAGNOSIS — M47812 Spondylosis without myelopathy or radiculopathy, cervical region: Secondary | ICD-10-CM | POA: Diagnosis not present

## 2021-05-07 DIAGNOSIS — K219 Gastro-esophageal reflux disease without esophagitis: Secondary | ICD-10-CM | POA: Diagnosis not present

## 2021-05-07 DIAGNOSIS — M1711 Unilateral primary osteoarthritis, right knee: Secondary | ICD-10-CM | POA: Diagnosis not present

## 2021-05-07 DIAGNOSIS — M19019 Primary osteoarthritis, unspecified shoulder: Secondary | ICD-10-CM | POA: Diagnosis not present

## 2021-05-07 DIAGNOSIS — M170 Bilateral primary osteoarthritis of knee: Secondary | ICD-10-CM | POA: Diagnosis not present

## 2021-05-07 NOTE — Therapy (Signed)
[facility] [address] [place], [place], [address] Phone: [phone]   Fax:  [phone]  Physical Therapy Treatment  Patient Details  Name: Bernard Young MRN: [id number] Date of Birth: January 04, 1941 Referring Provider (PT): Rankins, Bill Salinas, MD   Encounter Date: 05/06/2021   PT End of Session - 05/06/21 2353     Visit Number 5    Number of Visits 17    Date for PT Re-Evaluation 07/14/21    Authorization Type [employer]    PT Start Time 1518    PT Stop Time 1558    PT Time Calculation (min) 40 min    Activity Tolerance Patient tolerated treatment well    Behavior During Therapy [facility] for tasks assessed/performed             Past Medical History:  Diagnosis Date   Arthritis    Diverticulosis of colon 05/2017   noted on CT abd/pelvis   Enlarged prostate 06/26/2017   mild , noted on CT abd/pelvis   Headache    Hypertension    Inguinal hernia 05/2017   small to moderate right containing bowel loop of sigmoid colon, noted on CT abd/pelvis   Numbness and tingling in left hand    PONV (postoperative nausea and vomiting)    very very bad nausea but no vomiting : "i get so sick of it and it is just awful ; even with a full stomach i cant vomit "    Renal mass, left    Shoulder pain    right    TIA (transient ischemic attack)    nearly 20 years ago     Past Surgical History:  Procedure Laterality Date   JOINT REPLACEMENT Left    left knee arthoscopy   2001   ROBOT ASSISTED LAPAROSCOPIC NEPHRECTOMY Left 10/29/2017   Procedure: XI ROBOTIC ASSISTED LAPAROSCOPIC / PARTIAL NEPHRECTOMY;  Surgeon: Raynelle Bring, MD;  Location: [facility];  Service: Urology;  Laterality: Left;  NEEDS 210 MIN    There were no vitals filed for this visit.   Subjective Assessment - 05/06/21 1512     Subjective Pt states he felt stiff after prior Rx and used his pain meds.   "My shoulder always bothers me.  It always hurts".  Pt  reports compliance with HEP and states he thinks it's helping.  Pt reports his wife thinks it's helping.  Pt states it's not as bad right now, but took pain meds earlier primarily for his shoulder.  Pt states his back is worse 1st thing in AM and is hunched over.  It takes an hour for him to stand up straight after waking up.  Pt states he doesn't really like water.    Pertinent History TIA, OA, kidney cancer, L THA 2016, L hernia, R knee pain, R shoulder pain    Currently in Pain? Yes    Pain Score --   2-3/10 pain in lumbar, 4/10 pain in shoulder.  Shoulder was a 7/10 before he took his pain meds.             -Pt seen for aquatic therapy today.  Treatment took place in water 3.5-4.25 ft in depth at the [facility] pool. Temp of water was 94.  Pt entered/exited the pool via stairs independently with bilat rail. -Reviewed response to prior Rx, current function, HEP compliance, and pain level. -Introduction to water.  PT had Pt stand at different depths of water in order to feel the  buoyancy and how it relates to her sx's.     -Pt ambulated 3 laps, sidestepped 3 laps, and ambulated bwd x 2 laps with verbal and visual instruction.   -Pt performed:     -marching 2 x 10 reps and hip abduction with bilat UE assist on pool edge.     -Seated bicycles 2x20 reps     -Standing Hip flexor stretch 2x20-30 sec bilat.  Standing HS stretch on step 2x20 sec bilat.        -Passive floating with ankle floats, neck float, and 2 squoodles to promote relaxation and improve pain, tightness, and muscle tension  Pt requires buoyancy for support and to offload joints with strengthening exercises. Viscosity of the water is needed for resistance of strengthening; water current perturbations provides challenge to standing balance unsupported, requiring increased core activation.     PT Education - 05/06/21 2352     Education Details Educated pt on the properties and benefits of aquatic therapy including  buoyance and viscosity for improved core and LE strength, balance, and reduced stress on joints.  Instructed pt in correct form of exercises.    Person(s) Educated Patient    Methods Explanation;Demonstration;Verbal cues    Comprehension Verbal cues required;Returned demonstration;Verbalized understanding              PT Short Term Goals - 04/15/21 1647       PT SHORT TERM GOAL #1   Title Pt will become independent with HEP in order to demonstrate synthesis of PT education.    Time 2    Period Weeks    Status New      PT SHORT TERM GOAL #2   Title Pt will be able to demonstrate ability to squat to pick up item from floor in order to demonstrate functional improvement in L/S function for self-care and house hold duties.    Time 4    Period Weeks    Status New      PT SHORT TERM GOAL #3   Title Pt will demonstrate/report ability to stand/sit/sleep without pain in order to demonstrate functional improvement and tolerance to static positioning.    Baseline 4    Period Weeks    Status New               PT Long Term Goals - 04/15/21 1650       PT LONG TERM GOAL #1   Title Pt will become independent with final HEP in order to demonstrate synthesis of PT education.    Time 8    Period Weeks    Status New      PT LONG TERM GOAL #2   Title Pt will demonstrate at least a 12.8 improvement in Oswestry Index in order to demonstrate a clinically significant change in LBP and function.    Time 8    Period Weeks    Status New      PT LONG TERM GOAL #3   Title Pt will be able to perform 5XSTS in under 12  in order to demonstrate functional improvement above the cut off score for older adults.    Time 8    Period Weeks    Status New                   Plan - 05/06/21 2353     Clinical Impression Statement Pt has constant shoulder pain and took pain meds prior to Rx.  Initiated Aquatic Therapy today and pt  tolerated Rx well.  Pt performed aquatic exercises well  with instruction and cuing for correct form.  Pt states he felt good with passive floating.  Pt reports improved lumar pain from 2-3/10 before Rx to 1/10 after Rx.  Pt should benefit from cont skilled PT services to address ongoing goals, reduce pain, and maximize functional mobility.    PT Treatment/Interventions ADLs/Self Care Home Management;Aquatic Therapy;Cryotherapy;Electrical Stimulation;Iontophoresis '4mg'$ /ml Dexamethasone;Moist Heat;Traction;Ultrasound;Gait training;Stair training;Functional mobility training;Therapeutic activities;Therapeutic exercise;Balance training;Neuromuscular re-education;Patient/family education;Orthotic Fit/Training;Manual techniques;Passive range of motion;Scar mobilization;Dry needling;Taping;Vasopneumatic Device;Joint Manipulations;Spinal Manipulations    PT Next Visit Plan Land therapy next visit.  Cont with land and aquatic therapy if pt responds well.  review HEP.    PT Home Exercise Plan Access Code [email or web address]    Consulted and Agree with Plan of Care Patient             Patient will benefit from skilled therapeutic intervention in order to improve the following deficits and impairments:  Abnormal gait, Increased fascial restricitons, Pain, Improper body mechanics, Postural dysfunction, Decreased mobility, Increased muscle spasms, Decreased activity tolerance, Decreased range of motion, Decreased strength, Hypomobility, Difficulty walking, Impaired flexibility, Decreased balance  Visit Diagnosis: Pain, lumbar region  Stiffness of left hip, not elsewhere classified  Muscle weakness (generalized)  Difficulty walking     Problem List Patient Active Problem List   Diagnosis Date Noted   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

## 2021-05-09 ENCOUNTER — Other Ambulatory Visit: Payer: Self-pay

## 2021-05-09 ENCOUNTER — Ambulatory Visit (HOSPITAL_BASED_OUTPATIENT_CLINIC_OR_DEPARTMENT_OTHER): Payer: PPO | Admitting: Physical Therapy

## 2021-05-09 DIAGNOSIS — M545 Low back pain, unspecified: Secondary | ICD-10-CM | POA: Diagnosis not present

## 2021-05-09 DIAGNOSIS — R262 Difficulty in walking, not elsewhere classified: Secondary | ICD-10-CM

## 2021-05-09 DIAGNOSIS — M25652 Stiffness of left hip, not elsewhere classified: Secondary | ICD-10-CM

## 2021-05-09 DIAGNOSIS — M6281 Muscle weakness (generalized): Secondary | ICD-10-CM

## 2021-05-09 NOTE — Therapy (Signed)
[facility] [address] [place], [place], [address] Phone: [phone]   Fax:  [phone]  Physical Therapy Treatment  Patient Details  Name: Bernard Young MRN: [id number] Date of Birth: 06-27-1941 Referring Provider (PT): Rankins, Bill Salinas, MD   Encounter Date: 05/09/2021   PT End of Session - 05/09/21 1535     Visit Number 6    Number of Visits 17    Date for PT Re-Evaluation 07/14/21    Authorization Type [employer]    PT Start Time 1520    PT Stop Time 1600    PT Time Calculation (min) 40 min    Activity Tolerance Patient tolerated treatment well    Behavior During Therapy [facility] for tasks assessed/performed             Past Medical History:  Diagnosis Date   Arthritis    Diverticulosis of colon 05/2017   noted on CT abd/pelvis   Enlarged prostate 06/26/2017   mild , noted on CT abd/pelvis   Headache    Hypertension    Inguinal hernia 05/2017   small to moderate right containing bowel loop of sigmoid colon, noted on CT abd/pelvis   Numbness and tingling in left hand    PONV (postoperative nausea and vomiting)    very very bad nausea but no vomiting : "i get so sick of it and it is just awful ; even with a full stomach i cant vomit "    Renal mass, left    Shoulder pain    right    TIA (transient ischemic attack)    nearly 20 years ago     Past Surgical History:  Procedure Laterality Date   JOINT REPLACEMENT Left    left knee arthoscopy   2001   ROBOT ASSISTED LAPAROSCOPIC NEPHRECTOMY Left 10/29/2017   Procedure: XI ROBOTIC ASSISTED LAPAROSCOPIC / PARTIAL NEPHRECTOMY;  Surgeon: Raynelle Bring, MD;  Location: [facility];  Service: Urology;  Laterality: Left;  NEEDS 210 MIN    There were no vitals filed for this visit.   Subjective Assessment - 05/09/21 1528     Subjective Pt states the back pain is slightly elevated today. He states he felt good after the pool session. Pt states he is still  sleeping in the recliner at night due to his shoulder.    Pertinent History TIA, OA, kidney cancer, L THA 2016, L hernia, R knee pain, R shoulder pain    Currently in Pain? Yes    Pain Score 4     Pain Location Back    Pain Orientation Lower    Pain Descriptors / Indicators Aching;Sore               Pt seen for aquatic therapy today.  Treatment took place in water 3.5-4.75 ft in depth at the [facility] pool. Temp of water was 91.  Pt entered/exited the pool via stairs independently with bilat rail. -Reviewed response to prior aquatic session, current function, HEP compliance, and pain.  Warm up: 4 laps, sidestep and retro step Exercise:     -marching 2 x 10 reps and hip abduction with single UE assist      -Seated bicycles on bench 2x20 reps     -Standing Hip flexor stretch 2x20-30 sec bilat, standing board press-half squat 2x10, standing half sqats with UE assist 2x10, deep water cycling with 2 float assist 13mn, standing hip extension 10x  Pt requires buoyancy for support and to offload joints  with strengthening exercises. Viscosity of the water is needed for resistance of strengthening; water current perturbations provides challenge to standing balance unsupported, requiring increased core activation.            PT Short Term Goals - 04/15/21 1647       PT SHORT TERM GOAL #1   Title Pt will become independent with HEP in order to demonstrate synthesis of PT education.    Time 2    Period Weeks    Status New      PT SHORT TERM GOAL #2   Title Pt will be able to demonstrate ability to squat to pick up item from floor in order to demonstrate functional improvement in L/S function for self-care and house hold duties.    Time 4    Period Weeks    Status New      PT SHORT TERM GOAL #3   Title Pt will demonstrate/report ability to stand/sit/sleep without pain in order to demonstrate functional improvement and tolerance to static positioning.    Baseline 4     Period Weeks    Status New               PT Long Term Goals - 04/15/21 1650       PT LONG TERM GOAL #1   Title Pt will become independent with final HEP in order to demonstrate synthesis of PT education.    Time 8    Period Weeks    Status New      PT LONG TERM GOAL #2   Title Pt will demonstrate at least a 12.8 improvement in Oswestry Index in order to demonstrate a clinically significant change in LBP and function.    Time 8    Period Weeks    Status New      PT LONG TERM GOAL #3   Title Pt will be able to perform 5XSTS in under 12  in order to demonstrate functional improvement above the cut off score for older adults.    Time 8    Period Weeks    Status New                   Plan - 05/09/21 1535     Clinical Impression Statement Pt with good response to continued aquatic therapy session. Pt was able to tolerate increased repetitions and more resistance exercise without increasing pain. Pt had mild difficulty with postural stability due to water turbulence and external perturbation. Pt ended session with report of decreased pain as well as improved upright posture. Pt had decreased kyphosis as well as increased trunk extension when exiting pool. Pt to have next session on land in order to progress land exercise and HEP. Pt would benefit from continued skilled therapy in order to reach goals and maximize functional lumbopelvic strength and ROM for prevention of further functional decline.    PT Treatment/Interventions ADLs/Self Care Home Management;Aquatic Therapy;Cryotherapy;Electrical Stimulation;Iontophoresis '4mg'$ /ml Dexamethasone;Moist Heat;Traction;Ultrasound;Gait training;Stair training;Functional mobility training;Therapeutic activities;Therapeutic exercise;Balance training;Neuromuscular re-education;Patient/family education;Orthotic Fit/Training;Manual techniques;Passive range of motion;Scar mobilization;Dry needling;Taping;Vasopneumatic Device;Joint  Manipulations;Spinal Manipulations    PT Next Visit Plan Land therapy next visit.  Cont with land and aquatic therapy if pt responds well.  review HEP.    PT Home Exercise Plan Access Code [email or web address]    Consulted and Agree with Plan of Care Patient             Patient will benefit from skilled therapeutic intervention in order to improve the following  deficits and impairments:  Abnormal gait, Increased fascial restricitons, Pain, Improper body mechanics, Postural dysfunction, Decreased mobility, Increased muscle spasms, Decreased activity tolerance, Decreased range of motion, Decreased strength, Hypomobility, Difficulty walking, Impaired flexibility, Decreased balance  Visit Diagnosis: Pain, lumbar region  Stiffness of left hip, not elsewhere classified  Muscle weakness (generalized)  Difficulty walking     Problem List Patient Active Problem List   Diagnosis Date Noted   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

## 2021-05-15 ENCOUNTER — Other Ambulatory Visit: Payer: Self-pay

## 2021-05-15 ENCOUNTER — Ambulatory Visit (HOSPITAL_BASED_OUTPATIENT_CLINIC_OR_DEPARTMENT_OTHER): Payer: PPO | Admitting: Physical Therapy

## 2021-05-15 ENCOUNTER — Encounter (HOSPITAL_BASED_OUTPATIENT_CLINIC_OR_DEPARTMENT_OTHER): Payer: Self-pay | Admitting: Physical Therapy

## 2021-05-15 DIAGNOSIS — R262 Difficulty in walking, not elsewhere classified: Secondary | ICD-10-CM

## 2021-05-15 DIAGNOSIS — M545 Low back pain, unspecified: Secondary | ICD-10-CM

## 2021-05-15 DIAGNOSIS — M6281 Muscle weakness (generalized): Secondary | ICD-10-CM

## 2021-05-15 DIAGNOSIS — M25652 Stiffness of left hip, not elsewhere classified: Secondary | ICD-10-CM

## 2021-05-15 NOTE — Patient Instructions (Signed)
Access Code: [email or web address] URL: [email or web address] Date: 05/15/2021 Prepared by: Daleen Bo  Exercises Seated Pelvic Tilt - 3-4 x daily - 7 x weekly - 2 sets - 10 reps Seated Scapular Retraction - 3-4 x daily - 7 x weekly - 2 sets - 10 reps Standing Hip Flexor Stretch - 2 x daily - 7 x weekly - 1 sets - 3 reps - 30 hold Seated Quadratus Lumborum Stretch in Chair - 2 x daily - 7 x weekly - 3 sets - 3 reps - 30 hold Supine Lower Trunk Rotation - 2 x daily - 7 x weekly - 1 sets - 10 reps - 3 hold Supine Bridge - 1 x daily - 7 x weekly - 2 sets - 10 reps

## 2021-05-15 NOTE — Therapy (Signed)
[facility] [address] [place], [place], [address] Phone: [phone]   Fax:  [phone]  Physical Therapy Treatment  Patient Details  Name: Alanson Dumont MRN: [id number] Date of Birth: 06-Oct-1940 Referring Provider (PT): Rankins, Bill Salinas, MD   Encounter Date: 05/15/2021   PT End of Session - 05/15/21 1524     Visit Number 7    Number of Visits 17    Date for PT Re-Evaluation 07/14/21    Authorization Type [employer]    PT Start Time [id number]    PT Stop Time 1555    PT Time Calculation (min) 40 min    Activity Tolerance Patient tolerated treatment well    Behavior During Therapy [facility] for tasks assessed/performed             Past Medical History:  Diagnosis Date   Arthritis    Diverticulosis of colon 05/2017   noted on CT abd/pelvis   Enlarged prostate 06/26/2017   mild , noted on CT abd/pelvis   Headache    Hypertension    Inguinal hernia 05/2017   small to moderate right containing bowel loop of sigmoid colon, noted on CT abd/pelvis   Numbness and tingling in left hand    PONV (postoperative nausea and vomiting)    very very bad nausea but no vomiting : "i get so sick of it and it is just awful ; even with a full stomach i cant vomit "    Renal mass, left    Shoulder pain    right    TIA (transient ischemic attack)    nearly 20 years ago     Past Surgical History:  Procedure Laterality Date   JOINT REPLACEMENT Left    left knee arthoscopy   2001   ROBOT ASSISTED LAPAROSCOPIC NEPHRECTOMY Left 10/29/2017   Procedure: XI ROBOTIC ASSISTED LAPAROSCOPIC / PARTIAL NEPHRECTOMY;  Surgeon: Raynelle Bring, MD;  Location: [facility];  Service: Urology;  Laterality: Left;  NEEDS 210 MIN    There were no vitals filed for this visit.   Subjective Assessment - 05/15/21 1521     Subjective Pt states that the back pain feels better but he has had more shoulder and knee pain. He states he has been sleeping in  the bed again which has helped. He does not that he has had his back pain wake him up at night due to his hip pain.    Pertinent History TIA, OA, kidney cancer, L THA 2016, L hernia, R knee pain, R shoulder pain    Currently in Pain? Yes    Pain Score 5     Pain Location Back    Pain Orientation Lower    Pain Descriptors / Indicators Aching;Sore                  [facility] Adult PT Treatment/Exercise - 05/15/21 0001       Posture/Postural Control   Posture/Postural Control Postural limitations    Postural Limitations Increased thoracic kyphosis;Posterior pelvic tilt;Decreased lumbar lordosis;Flexed trunk      Exercises   Exercises Lumbar      Lumbar Exercises: Stretches   Lower Trunk Rotation Limitations 10x 3s    Other Lumbar Stretch Exercise seated lumbar flexion stretch 10s 10x          Lumbar Exercises: Aerobic   Nustep L3 7 min      Lumbar Exercises: Standing   Other Standing Lumbar Exercises standing L/S performed and reviewed for  home postural changes               Manual Therapy   Manual Therapy Soft tissue mobilization;Joint mobilization    Joint Mobilization seated L1-5 CPA grade II    Soft tissue mobilization bilateral paraspinal and QL STM, L S/L position R hip gluteals                 PT Education - 05/15/21 1524     Education Details exercise progression, HEP, joint protection, postural changes, acceptable levels of pain    Person(s) Educated Patient    Methods Explanation;Demonstration;Tactile cues;Verbal cues    Comprehension Verbalized understanding;Returned demonstration;Verbal cues required;Tactile cues required              PT Short Term Goals - 04/15/21 1647       PT SHORT TERM GOAL #1   Title Pt will become independent with HEP in order to demonstrate synthesis of PT education.    Time 2    Period Weeks    Status New      PT SHORT TERM GOAL #2   Title Pt will be able to demonstrate ability to squat to pick up item from  floor in order to demonstrate functional improvement in L/S function for self-care and house hold duties.    Time 4    Period Weeks    Status New      PT SHORT TERM GOAL #3   Title Pt will demonstrate/report ability to stand/sit/sleep without pain in order to demonstrate functional improvement and tolerance to static positioning.    Baseline 4    Period Weeks    Status New               PT Long Term Goals - 04/15/21 1650       PT LONG TERM GOAL #1   Title Pt will become independent with final HEP in order to demonstrate synthesis of PT education.    Time 8    Period Weeks    Status New      PT LONG TERM GOAL #2   Title Pt will demonstrate at least a 12.8 improvement in Oswestry Index in order to demonstrate a clinically significant change in LBP and function.    Time 8    Period Weeks    Status New      PT LONG TERM GOAL #3   Title Pt will be able to perform 5XSTS in under 12  in order to demonstrate functional improvement above the cut off score for older adults.    Time 8    Period Weeks    Status New                   Plan - 05/15/21 1526     Clinical Impression Statement Pt presents to land visit today with signficant increase in reported pain since last session. Pt had increased lumbopelvic stiffness and had good response to gentle stretching and light hip extension exercise at today's session. Pt had reported decrease in pain to  2/10 from 5/10. Pt stiffness likely continuing to exacerbate pain. Pt given thorough edu about increasing daily movement variance and posture in order to reduce prolong static positioning. HEP updated accordingly to improve ROM. Pt would benefit from continued skilled therapy in order to reach goals and maximize functional lumbopelvic strength and ROM for prevention of further functional decline.    PT Treatment/Interventions ADLs/Self Care Home Management;Aquatic Therapy;Cryotherapy;Electrical Stimulation;Iontophoresis '4mg'$ /ml  Dexamethasone;Moist Heat;Traction;Ultrasound;Gait  training;Stair training;Functional mobility training;Therapeutic activities;Therapeutic exercise;Balance training;Neuromuscular re-education;Patient/family education;Orthotic Fit/Training;Manual techniques;Passive range of motion;Scar mobilization;Dry needling;Taping;Vasopneumatic Device;Joint Manipulations;Spinal Manipulations    PT Next Visit Plan Land therapy next visit.  Cont with land and aquatic therapy if pt responds well.  review HEP.    PT Home Exercise Plan Access Code [email or web address]    Consulted and Agree with Plan of Care Patient             Patient will benefit from skilled therapeutic intervention in order to improve the following deficits and impairments:  Abnormal gait, Increased fascial restricitons, Pain, Improper body mechanics, Postural dysfunction, Decreased mobility, Increased muscle spasms, Decreased activity tolerance, Decreased range of motion, Decreased strength, Hypomobility, Difficulty walking, Impaired flexibility, Decreased balance  Visit Diagnosis: Pain, lumbar region  Stiffness of left hip, not elsewhere classified  Muscle weakness (generalized)  Difficulty walking     Problem List Patient Active Problem List   Diagnosis Date Noted   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 [place], [place], [address] Phone: [phone]   Fax:  [phone]  Name: Jaquaylon Whitlock MRN: [id number] Date of Birth: [birth year]

## 2021-05-20 ENCOUNTER — Encounter (HOSPITAL_BASED_OUTPATIENT_CLINIC_OR_DEPARTMENT_OTHER): Payer: Self-pay | Admitting: Physical Therapy

## 2021-05-20 ENCOUNTER — Other Ambulatory Visit: Payer: Self-pay

## 2021-05-20 ENCOUNTER — Ambulatory Visit (HOSPITAL_BASED_OUTPATIENT_CLINIC_OR_DEPARTMENT_OTHER): Payer: PPO | Admitting: Physical Therapy

## 2021-05-20 DIAGNOSIS — M545 Low back pain, unspecified: Secondary | ICD-10-CM

## 2021-05-20 DIAGNOSIS — M6281 Muscle weakness (generalized): Secondary | ICD-10-CM

## 2021-05-20 DIAGNOSIS — R262 Difficulty in walking, not elsewhere classified: Secondary | ICD-10-CM

## 2021-05-20 DIAGNOSIS — M25652 Stiffness of left hip, not elsewhere classified: Secondary | ICD-10-CM

## 2021-05-20 NOTE — Therapy (Signed)
[facility] [address] [place], [place], [address] Phone: [phone]   Fax:  [phone]  Physical Therapy Treatment  Patient Details  Name: Bernard Young MRN: [id number] Date of Birth: November 23, 1940 Referring Provider (PT): Rankins, Bill Salinas, MD   Encounter Date: 05/20/2021   PT End of Session - 05/20/21 1528     Visit Number 8    Number of Visits 17    Date for PT Re-Evaluation 07/14/21    Authorization Type [employer]    PT Start Time 1520    PT Stop Time 1600    PT Time Calculation (min) 40 min    Activity Tolerance Patient tolerated treatment well    Behavior During Therapy [facility] for tasks assessed/performed             Past Medical History:  Diagnosis Date   Arthritis    Diverticulosis of colon 05/2017   noted on CT abd/pelvis   Enlarged prostate 06/26/2017   mild , noted on CT abd/pelvis   Headache    Hypertension    Inguinal hernia 05/2017   small to moderate right containing bowel loop of sigmoid colon, noted on CT abd/pelvis   Numbness and tingling in left hand    PONV (postoperative nausea and vomiting)    very very bad nausea but no vomiting : "i get so sick of it and it is just awful ; even with a full stomach i cant vomit "    Renal mass, left    Shoulder pain    right    TIA (transient ischemic attack)    nearly 20 years ago     Past Surgical History:  Procedure Laterality Date   JOINT REPLACEMENT Left    left knee arthoscopy   2001   ROBOT ASSISTED LAPAROSCOPIC NEPHRECTOMY Left 10/29/2017   Procedure: XI ROBOTIC ASSISTED LAPAROSCOPIC / PARTIAL NEPHRECTOMY;  Surgeon: Raynelle Bring, MD;  Location: [facility];  Service: Urology;  Laterality: Left;  NEEDS 210 MIN    There were no vitals filed for this visit.   Subjective Assessment - 05/20/21 1527     Subjective Pt states the R shoulder is hurting but the low back is better. "feels maybe we are turning a corner."    Pertinent  History TIA, OA, kidney cancer, L THA 2016, L hernia, R knee pain, R shoulder pain    Currently in Pain? Yes    Pain Score 3     Pain Location Back    Pain Orientation Lower    Pain Descriptors / Indicators Aching;Sore                               [facility] Adult PT Treatment/Exercise - 05/20/21 0001       Posture/Postural Control   Posture/Postural Control Postural limitations    Postural Limitations Increased thoracic kyphosis;Posterior pelvic tilt;Decreased lumbar lordosis;Flexed trunk      Exercises   Exercises Lumbar      Lumbar Exercises: Stretches   Passive Hamstring Stretch Limitations 30s 3x    Lower Trunk Rotation Limitations 10x 3s    Other Lumbar Stretch Exercise seated lumbar flexion stretch 10s 10x    Other Lumbar Stretch Exercise SKTC with strap 5s 10x; T/S open book 5s 10x      Lumbar Exercises: Aerobic   Nustep L3 7 min      Lumbar Exercises: Standing   Other Standing Lumbar Exercises L/S  ext at wall 3s 10x              Bridge 2x10                       PT Education - 05/20/21 1528     Education Details exercise progression, HEP, joint protection, postural changes, acceptable levels of pain    Person(s) Educated Patient    Methods Explanation;Demonstration;Tactile cues;Verbal cues    Comprehension Verbalized understanding;Returned demonstration              PT Short Term Goals - 04/15/21 1647       PT SHORT TERM GOAL #1   Title Pt will become independent with HEP in order to demonstrate synthesis of PT education.    Time 2    Period Weeks    Status New      PT SHORT TERM GOAL #2   Title Pt will be able to demonstrate ability to squat to pick up item from floor in order to demonstrate functional improvement in L/S function for self-care and house hold duties.    Time 4    Period Weeks    Status New      PT SHORT TERM GOAL #3   Title Pt will demonstrate/report ability to stand/sit/sleep without pain in order  to demonstrate functional improvement and tolerance to static positioning.    Baseline 4    Period Weeks    Status New               PT Long Term Goals - 04/15/21 1650       PT LONG TERM GOAL #1   Title Pt will become independent with final HEP in order to demonstrate synthesis of PT education.    Time 8    Period Weeks    Status New      PT LONG TERM GOAL #2   Title Pt will demonstrate at least a 12.8 improvement in Oswestry Index in order to demonstrate a clinically significant change in LBP and function.    Time 8    Period Weeks    Status New      PT LONG TERM GOAL #3   Title Pt will be able to perform 5XSTS in under 12  in order to demonstrate functional improvement above the cut off score for older adults.    Time 8    Period Weeks    Status New                   Plan - 05/20/21 1549     Clinical Impression Statement Pt able to progress lumbar and thoracic mobility in today's session but is limited by R shoulder pain. Pt had good improvement in lumbar ROM and T/S rotation following session. Pt had mild increase in symptoms at end of session but did not exceed baseline pain. Pt reports decreased stiffness following session. Pt given continued edu about increasing daily movement variance. Pt would benefit from continued skilled therapy in order to reach goals and maximize functional lumbopelvic strength and ROM for prevention of further functional decline.    Personal Factors and Comorbidities Age;Fitness;Past/Current Experience;Comorbidity 1;Comorbidity 2;Comorbidity 3+    Examination-Activity Limitations Transfers;Stand;Lift;Squat;Sit;Carry;Bend    Examination-Participation Restrictions Interpersonal Relationship;Shop;Other;Community Activity    Rehab Potential Good    PT Frequency 2x / week    PT Duration 8 weeks    PT Treatment/Interventions ADLs/Self Care Home Management;Aquatic Therapy;Cryotherapy;Electrical Stimulation;Iontophoresis '4mg'$ /ml  Dexamethasone;Moist Heat;Traction;Ultrasound;Gait training;Stair  training;Functional mobility training;Therapeutic activities;Therapeutic exercise;Balance training;Neuromuscular re-education;Patient/family education;Orthotic Fit/Training;Manual techniques;Passive range of motion;Scar mobilization;Dry needling;Taping;Vasopneumatic Device;Joint Manipulations;Spinal Manipulations    PT Next Visit Plan review HEP, progression L/S and T/S ROM exercise, trial leg press    PT Home Exercise Plan Access Code [email or web address]    Consulted and Agree with Plan of Care Patient             Patient will benefit from skilled therapeutic intervention in order to improve the following deficits and impairments:  Abnormal gait, Increased fascial restricitons, Pain, Improper body mechanics, Postural dysfunction, Decreased mobility, Increased muscle spasms, Decreased activity tolerance, Decreased range of motion, Decreased strength, Hypomobility, Difficulty walking, Impaired flexibility, Decreased balance  Visit Diagnosis: Pain, lumbar region  Stiffness of left hip, not elsewhere classified  Muscle weakness (generalized)  Difficulty walking     Problem List Patient Active Problem List   Diagnosis Date Noted   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 [place], [place], [address] Phone: [phone]   Fax:  [phone]  Name: Rilo Trager MRN: [id number] Date of Birth: 1940/11/10

## 2021-05-21 DIAGNOSIS — M170 Bilateral primary osteoarthritis of knee: Secondary | ICD-10-CM | POA: Diagnosis not present

## 2021-05-21 DIAGNOSIS — Z5181 Encounter for therapeutic drug level monitoring: Secondary | ICD-10-CM | POA: Diagnosis not present

## 2021-05-21 DIAGNOSIS — G8929 Other chronic pain: Secondary | ICD-10-CM | POA: Diagnosis not present

## 2021-05-21 DIAGNOSIS — E7800 Pure hypercholesterolemia, unspecified: Secondary | ICD-10-CM | POA: Diagnosis not present

## 2021-05-21 DIAGNOSIS — E790 Hyperuricemia without signs of inflammatory arthritis and tophaceous disease: Secondary | ICD-10-CM | POA: Diagnosis not present

## 2021-05-21 DIAGNOSIS — I10 Essential (primary) hypertension: Secondary | ICD-10-CM | POA: Diagnosis not present

## 2021-05-21 DIAGNOSIS — M19019 Primary osteoarthritis, unspecified shoulder: Secondary | ICD-10-CM | POA: Diagnosis not present

## 2021-05-21 DIAGNOSIS — K4090 Unilateral inguinal hernia, without obstruction or gangrene, not specified as recurrent: Secondary | ICD-10-CM | POA: Diagnosis not present

## 2021-05-21 DIAGNOSIS — K219 Gastro-esophageal reflux disease without esophagitis: Secondary | ICD-10-CM | POA: Diagnosis not present

## 2021-05-21 DIAGNOSIS — M19071 Primary osteoarthritis, right ankle and foot: Secondary | ICD-10-CM | POA: Diagnosis not present

## 2021-05-21 DIAGNOSIS — M47812 Spondylosis without myelopathy or radiculopathy, cervical region: Secondary | ICD-10-CM | POA: Diagnosis not present

## 2021-05-21 DIAGNOSIS — M1711 Unilateral primary osteoarthritis, right knee: Secondary | ICD-10-CM | POA: Diagnosis not present

## 2021-05-22 ENCOUNTER — Ambulatory Visit (HOSPITAL_BASED_OUTPATIENT_CLINIC_OR_DEPARTMENT_OTHER): Payer: PPO | Admitting: Physical Therapy

## 2021-05-22 ENCOUNTER — Other Ambulatory Visit: Payer: Self-pay

## 2021-05-22 ENCOUNTER — Encounter (HOSPITAL_BASED_OUTPATIENT_CLINIC_OR_DEPARTMENT_OTHER): Payer: Self-pay | Admitting: Physical Therapy

## 2021-05-22 DIAGNOSIS — M545 Low back pain, unspecified: Secondary | ICD-10-CM | POA: Diagnosis not present

## 2021-05-22 DIAGNOSIS — M6281 Muscle weakness (generalized): Secondary | ICD-10-CM

## 2021-05-22 DIAGNOSIS — R262 Difficulty in walking, not elsewhere classified: Secondary | ICD-10-CM

## 2021-05-22 DIAGNOSIS — M25652 Stiffness of left hip, not elsewhere classified: Secondary | ICD-10-CM

## 2021-05-22 NOTE — Therapy (Addendum)
[facility] [address] [place], [place], [address] Phone: [phone]   Fax:  [phone]  Physical Therapy Progress Notes/ Discharge  Patient Details  Name: Bernard Young MRN: [id number] Date of Birth: 1941/07/29 Referring Provider (PT): Rankins, Bill Salinas, MD   Encounter Date: 05/22/2021   PT End of Session - 05/22/21 1537     Visit Number 9    Number of Visits 17    Date for PT Re-Evaluation 07/14/21    Authorization Type [employer]    PT Start Time 1520    PT Stop Time 1423    PT Time Calculation (min) 45 min    Activity Tolerance Patient tolerated treatment well    Behavior During Therapy [facility] for tasks assessed/performed             Past Medical History:  Diagnosis Date   Arthritis    Diverticulosis of colon 05/2017   noted on CT abd/pelvis   Enlarged prostate 06/26/2017   mild , noted on CT abd/pelvis   Headache    Hypertension    Inguinal hernia 05/2017   small to moderate right containing bowel loop of sigmoid colon, noted on CT abd/pelvis   Numbness and tingling in left hand    PONV (postoperative nausea and vomiting)    very very bad nausea but no vomiting : "i get so sick of it and it is just awful ; even with a full stomach i cant vomit "    Renal mass, left    Shoulder pain    right    TIA (transient ischemic attack)    nearly 20 years ago     Past Surgical History:  Procedure Laterality Date   JOINT REPLACEMENT Left    left knee arthoscopy   2001   ROBOT ASSISTED LAPAROSCOPIC NEPHRECTOMY Left 10/29/2017   Procedure: XI ROBOTIC ASSISTED LAPAROSCOPIC / PARTIAL NEPHRECTOMY;  Surgeon: Raynelle Bring, MD;  Location: [facility];  Service: Urology;  Laterality: Left;  NEEDS 210 MIN    There were no vitals filed for this visit.   Subjective Assessment - 05/22/21 1535     Subjective Pt states the back is continuing to feel better. He reports he is using the exercises to loosen up in the  mornings and evenings. Pt states he is now sleeping more and able to sleep consistently in bed.    Pertinent History TIA, OA, kidney cancer, L THA 2016, L hernia, R knee pain, R shoulder pain    Currently in Pain? No/denies    Pain Score 0-No pain                [facility] PT Assessment - 05/22/21 0001       Assessment   Medical Diagnosis M62.830 (ICD-10-CM) - Muscle spasm of back    Referring Provider (PT) Rankins, Bill Salinas, MD    Prior Therapy Knee and gait      Precautions   Precautions None      Restrictions   Weight Bearing Restrictions No      Balance Screen   Has the patient fallen in the past 6 months No      [place] residence    Living Arrangements Spouse/significant other    Type of [place]      Prior Function   Level of [place]   Overall Cognitive Status Within Functional Limits for tasks assessed  Observation/Other Assessments   Other Surveys  Oswestry Disability Index    Oswestry Disability Index  Oswestry Score: 12 / 50 or 24 %      Functional Tests   Functional tests Sit to Stand;Squat      Squat   Comments decreased depth, flexed trunk position      Sit to Stand   Comments flexed trunk, no UE support, improved hip extension/upright posture      Posture/Postural Control   Postural Limitations Increased thoracic kyphosis;Decreased lumbar lordosis      AROM   Overall AROM Comments L/S flexion 75%, ext 5%, bilat SB 60%, bilat rotation 65%      PROM   Overall PROM Comments L hip flexion 110, IR 20, ER 45; bilat hip extension 5 deg      Strength   Overall Strength Comments R hip 4+/5 throughout, L hip 4+/5                                  Palpation comment hypertonicity of bilateral L/S paraspinals, QL, and hip rotators      Transfers   Transfers Stand to Sit;Sit to Supine;Supine to Sit    Five time sit to stand comments  --    Stand to Sit Without upper  extremity assist    Supine to Sit Other (comment)   decreased need for UE assist, smoother transition/less time; R shoulder pain hindering movement   Sit to Supine Other (comment)   decreased need for UE assist, smoother transition/less time; R shoulder pain hindering movement     Ambulation/Gait   Ambulation Distance (Feet) 55 Feet    Gait Pattern Decreased stride length;Trunk flexed      Standardized Balance Assessment   Standardized Balance Assessment Five Times Sit to Stand    Five times sit to stand comments  14.8                           [facility] Adult PT Treatment/Exercise - 05/22/21 0001       Posture/Postural Control   Posture/Postural Control Postural limitations      Exercises   Exercises Lumbar      Lumbar Exercises: Stretches   Passive Hamstring Stretch Limitations 30s 3x    Lower Trunk Rotation Limitations 10x 3s    Other Lumbar Stretch Exercise seated lumbar flexion stretch 10s 10x    Other Lumbar Stretch Exercise T/S open book in S/L 3s 10x each     Lumbar Exercises: Aerobic   Nustep L3 7 min      Lumbar Exercises: Standing   Other Standing Lumbar Exercises L/S ext at wall 3s 10x      Lumbar Exercises: Seated   Other Seated Lumbar Exercises STS 3x5      Lumbar Exercises: Supine   Bridge Limitations bridge with YTB at knees 2x10                                     PT Education - 05/22/21 1636     Education Details exercise progression, HEP, joint protection, postural changes, progress/exam findings    Person(s) Educated Patient    Methods Explanation;Demonstration;Tactile cues;Verbal cues    Comprehension Verbalized understanding;Returned demonstration              PT Short Term Goals -  05/22/21 1639       PT SHORT TERM GOAL #1   Title Pt will become independent with HEP in order to demonstrate synthesis of PT education.    Time 2    Period Weeks    Status Achieved      PT SHORT TERM GOAL #2   Title Pt will  be able to demonstrate ability to squat to pick up item from floor in order to demonstrate functional improvement in L/S function for self-care and house hold duties.    Time 4    Period Weeks    Status Partially Met      PT SHORT TERM GOAL #3   Title Pt will demonstrate/report ability to stand/sit/sleep without pain in order to demonstrate functional improvement and tolerance to static positioning.    Baseline 4    Period Weeks    Status Partially Met               PT Long Term Goals - 05/22/21 1639       PT LONG TERM GOAL #1   Title Pt will become independent with final HEP in order to demonstrate synthesis of PT education.    Time 8    Period Weeks    Status On-going      PT LONG TERM GOAL #2   Title Pt will demonstrate at least a 12.8 improvement in Oswestry Index in order to demonstrate a clinically significant change in LBP and function.    Time 8    Period Weeks    Status On-going      PT LONG TERM GOAL #3   Title Pt will be able to perform 5XSTS in under 12  in order to demonstrate functional improvement above the cut off score for older adults.    Time 8    Period Weeks    Status On-going                   Plan - 05/22/21 1537     Clinical Impression Statement Pt with signficant improvement in quantative and qualatative measures as compared to initial evaluation. Pt has improved standing posture, tolerance to activity, and improved daily mobility. Most notably, pt is able to perform multiple STS without UE support from std height chair. Pt able to tolerate more loading and postural exercise in flexion and extension without increased pain. Pt continues to have R shoulder pain limiting his supine transfers. Pt appears to be more stiff dominant vs pain dominant at this time. Progress ROM and LE loading as tolerated. Pt would benefit from continued skilled therapy in order to reach goals and maximize functional lumbopelvic strength and ROM for prevention of  further functional decline.    Personal Factors and Comorbidities Age;Fitness;Past/Current Experience;Comorbidity 1;Comorbidity 2;Comorbidity 3+    Examination-Activity Limitations Transfers;Stand;Lift;Squat;Sit;Carry;Bend    Examination-Participation Restrictions Interpersonal Relationship;Shop;Other;Community Activity    Rehab Potential Good    PT Frequency 2x / week    PT Duration 8 weeks    PT Treatment/Interventions ADLs/Self Care Home Management;Aquatic Therapy;Cryotherapy;Electrical Stimulation;Iontophoresis 23m/ml Dexamethasone;Moist Heat;Traction;Ultrasound;Gait training;Stair training;Functional mobility training;Therapeutic activities;Therapeutic exercise;Balance training;Neuromuscular re-education;Patient/family education;Orthotic Fit/Training;Manual techniques;Passive range of motion;Scar mobilization;Dry needling;Taping;Vasopneumatic Device;Joint Manipulations;Spinal Manipulations    PT Next Visit Plan review HEP, progression L/S and T/S ROM exercise, hip stretching    PT Home Exercise Plan Access Code [email or web address]    Consulted and Agree with Plan of Care Patient             Patient will benefit from  skilled therapeutic intervention in order to improve the following deficits and impairments:  Abnormal gait, Increased fascial restricitons, Pain, Improper body mechanics, Postural dysfunction, Decreased mobility, Increased muscle spasms, Decreased activity tolerance, Decreased range of motion, Decreased strength, Hypomobility, Difficulty walking, Impaired flexibility, Decreased balance  Visit Diagnosis: Pain, lumbar region  Stiffness of left hip, not elsewhere classified  Muscle weakness (generalized)  Difficulty walking     Problem List Patient Active Problem List   Diagnosis Date Noted   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 [place], [place],  [address] Phone: [phone]   Fax:  [phone]  Name: Johndaniel Catlin MRN: [id number] Date of Birth: Feb 09, 1941   PHYSICAL THERAPY NO Visit DISCHARGE SUMMARY ADDENDUM  Visits from Start of Care: 9  Plan: Patient goals were not met. Patient is being discharged due to not returning to PT within 30 days. Status unknown.

## 2021-05-29 ENCOUNTER — Encounter (HOSPITAL_BASED_OUTPATIENT_CLINIC_OR_DEPARTMENT_OTHER): Payer: PPO | Admitting: Physical Therapy

## 2021-05-29 DIAGNOSIS — K4090 Unilateral inguinal hernia, without obstruction or gangrene, not specified as recurrent: Secondary | ICD-10-CM | POA: Diagnosis not present

## 2021-06-04 ENCOUNTER — Ambulatory Visit (HOSPITAL_BASED_OUTPATIENT_CLINIC_OR_DEPARTMENT_OTHER): Payer: PPO | Admitting: Physical Therapy

## 2021-07-10 DIAGNOSIS — H903 Sensorineural hearing loss, bilateral: Secondary | ICD-10-CM | POA: Diagnosis not present

## 2021-07-24 DIAGNOSIS — K219 Gastro-esophageal reflux disease without esophagitis: Secondary | ICD-10-CM | POA: Diagnosis not present

## 2021-07-24 DIAGNOSIS — M1711 Unilateral primary osteoarthritis, right knee: Secondary | ICD-10-CM | POA: Diagnosis not present

## 2021-07-24 DIAGNOSIS — E7800 Pure hypercholesterolemia, unspecified: Secondary | ICD-10-CM | POA: Diagnosis not present

## 2021-07-24 DIAGNOSIS — I10 Essential (primary) hypertension: Secondary | ICD-10-CM | POA: Diagnosis not present

## 2021-07-24 DIAGNOSIS — M19019 Primary osteoarthritis, unspecified shoulder: Secondary | ICD-10-CM | POA: Diagnosis not present

## 2021-07-24 DIAGNOSIS — M47812 Spondylosis without myelopathy or radiculopathy, cervical region: Secondary | ICD-10-CM | POA: Diagnosis not present

## 2021-07-24 DIAGNOSIS — M170 Bilateral primary osteoarthritis of knee: Secondary | ICD-10-CM | POA: Diagnosis not present

## 2021-07-24 DIAGNOSIS — G8929 Other chronic pain: Secondary | ICD-10-CM | POA: Diagnosis not present

## 2021-07-24 DIAGNOSIS — M19071 Primary osteoarthritis, right ankle and foot: Secondary | ICD-10-CM | POA: Diagnosis not present

## 2021-08-20 DIAGNOSIS — Z961 Presence of intraocular lens: Secondary | ICD-10-CM | POA: Diagnosis not present

## 2021-08-20 DIAGNOSIS — H40053 Ocular hypertension, bilateral: Secondary | ICD-10-CM | POA: Diagnosis not present

## 2021-09-05 ENCOUNTER — Emergency Department (HOSPITAL_BASED_OUTPATIENT_CLINIC_OR_DEPARTMENT_OTHER): Payer: PPO

## 2021-09-05 ENCOUNTER — Other Ambulatory Visit: Payer: Self-pay

## 2021-09-05 ENCOUNTER — Encounter (HOSPITAL_BASED_OUTPATIENT_CLINIC_OR_DEPARTMENT_OTHER): Payer: Self-pay

## 2021-09-05 ENCOUNTER — Observation Stay (HOSPITAL_BASED_OUTPATIENT_CLINIC_OR_DEPARTMENT_OTHER)
Admission: EM | Admit: 2021-09-05 | Discharge: 2021-09-07 | Disposition: A | Discharge status: Home / Self Care | Payer: PPO | Attending: Internal Medicine | Admitting: Internal Medicine

## 2021-09-05 DIAGNOSIS — Y900 Blood alcohol level of less than 20 mg/100 ml: Secondary | ICD-10-CM | POA: Diagnosis not present

## 2021-09-05 DIAGNOSIS — I671 Cerebral aneurysm, nonruptured: Secondary | ICD-10-CM | POA: Diagnosis not present

## 2021-09-05 DIAGNOSIS — Z79899 Other long term (current) drug therapy: Secondary | ICD-10-CM | POA: Insufficient documentation

## 2021-09-05 DIAGNOSIS — Z20822 Contact with and (suspected) exposure to covid-19: Secondary | ICD-10-CM | POA: Diagnosis not present

## 2021-09-05 DIAGNOSIS — R29818 Other symptoms and signs involving the nervous system: Secondary | ICD-10-CM | POA: Diagnosis not present

## 2021-09-05 DIAGNOSIS — R2681 Unsteadiness on feet: Secondary | ICD-10-CM | POA: Insufficient documentation

## 2021-09-05 DIAGNOSIS — K219 Gastro-esophageal reflux disease without esophagitis: Secondary | ICD-10-CM

## 2021-09-05 DIAGNOSIS — I10 Essential (primary) hypertension: Secondary | ICD-10-CM | POA: Diagnosis not present

## 2021-09-05 DIAGNOSIS — R4701 Aphasia: Secondary | ICD-10-CM | POA: Insufficient documentation

## 2021-09-05 DIAGNOSIS — I6523 Occlusion and stenosis of bilateral carotid arteries: Secondary | ICD-10-CM | POA: Diagnosis not present

## 2021-09-05 DIAGNOSIS — G459 Transient cerebral ischemic attack, unspecified: Secondary | ICD-10-CM | POA: Diagnosis not present

## 2021-09-05 DIAGNOSIS — R4781 Slurred speech: Secondary | ICD-10-CM | POA: Diagnosis not present

## 2021-09-05 DIAGNOSIS — I6782 Cerebral ischemia: Secondary | ICD-10-CM | POA: Insufficient documentation

## 2021-09-05 DIAGNOSIS — Z7982 Long term (current) use of aspirin: Secondary | ICD-10-CM | POA: Diagnosis not present

## 2021-09-05 DIAGNOSIS — E041 Nontoxic single thyroid nodule: Secondary | ICD-10-CM | POA: Insufficient documentation

## 2021-09-05 DIAGNOSIS — R479 Unspecified speech disturbances: Secondary | ICD-10-CM | POA: Diagnosis not present

## 2021-09-05 DIAGNOSIS — M109 Gout, unspecified: Secondary | ICD-10-CM

## 2021-09-05 LAB — URINALYSIS, ROUTINE W REFLEX MICROSCOPIC
Bilirubin Urine: NEGATIVE
Glucose, UA: NEGATIVE mg/dL
Hgb urine dipstick: NEGATIVE
Ketones, ur: NEGATIVE mg/dL
Leukocytes,Ua: NEGATIVE
Nitrite: NEGATIVE
Protein, ur: NEGATIVE mg/dL
Specific Gravity, Urine: 1.016 (ref 1.005–1.030)
pH: 7 (ref 5.0–8.0)

## 2021-09-05 LAB — RAPID URINE DRUG SCREEN, HOSP PERFORMED
Amphetamines: NOT DETECTED
Barbiturates: NOT DETECTED
Benzodiazepines: NOT DETECTED
Cocaine: NOT DETECTED
Opiates: NOT DETECTED
Tetrahydrocannabinol: NOT DETECTED

## 2021-09-05 LAB — PROTIME-INR
INR: 0.9 (ref 0.8–1.2)
Prothrombin Time: 12.5 seconds (ref 11.4–15.2)

## 2021-09-05 LAB — CBC
HCT: 40.7 % (ref 39.0–52.0)
Hemoglobin: 13.3 g/dL (ref 13.0–17.0)
MCH: 29.6 pg (ref 26.0–34.0)
MCHC: 32.7 g/dL (ref 30.0–36.0)
MCV: 90.6 fL (ref 80.0–100.0)
Platelets: 227 10*3/uL (ref 150–400)
RBC: 4.49 MIL/uL (ref 4.22–5.81)
RDW: 13.2 % (ref 11.5–15.5)
WBC: 6.9 10*3/uL (ref 4.0–10.5)
nRBC: 0 % (ref 0.0–0.2)

## 2021-09-05 LAB — DIFFERENTIAL
Abs Immature Granulocytes: 0.02 10*3/uL (ref 0.00–0.07)
Basophils Absolute: 0.1 10*3/uL (ref 0.0–0.1)
Basophils Relative: 1 %
Eosinophils Absolute: 0.4 10*3/uL (ref 0.0–0.5)
Eosinophils Relative: 6 %
Immature Granulocytes: 0 %
Lymphocytes Relative: 31 %
Lymphs Abs: 2.1 10*3/uL (ref 0.7–4.0)
Monocytes Absolute: 0.7 10*3/uL (ref 0.1–1.0)
Monocytes Relative: 10 %
Neutro Abs: 3.6 10*3/uL (ref 1.7–7.7)
Neutrophils Relative %: 52 %

## 2021-09-05 LAB — COMPREHENSIVE METABOLIC PANEL
ALT: 14 U/L (ref 0–44)
AST: 17 U/L (ref 15–41)
Albumin: 4.4 g/dL (ref 3.5–5.0)
Alkaline Phosphatase: 59 U/L (ref 38–126)
Anion gap: 8 (ref 5–15)
BUN: 20 mg/dL (ref 8–23)
CO2: 31 mmol/L (ref 22–32)
Calcium: 9.7 mg/dL (ref 8.9–10.3)
Chloride: 102 mmol/L (ref 98–111)
Creatinine, Ser: 1.02 mg/dL (ref 0.61–1.24)
GFR, Estimated: >60 mL/min (ref >60)
Glucose, Bld: 103 mg/dL — ABNORMAL HIGH (ref 70–99)
Potassium: 4.3 mmol/L (ref 3.5–5.1)
Sodium: 141 mmol/L (ref 135–145)
Total Bilirubin: 0.3 mg/dL (ref 0.3–1.2)
Total Protein: 7 g/dL (ref 6.5–8.1)

## 2021-09-05 LAB — RESP PANEL BY RT-PCR (FLU A&B, COVID) ARPGX2
Influenza A by PCR: NEGATIVE
Influenza B by PCR: NEGATIVE
SARS Coronavirus 2 by RT PCR: NEGATIVE

## 2021-09-05 LAB — CBG MONITORING, ED: Glucose-Capillary: 94 mg/dL (ref 70–99)

## 2021-09-05 LAB — ETHANOL: Alcohol, Ethyl (B): <10 mg/dL (ref <10)

## 2021-09-05 LAB — APTT: aPTT: 29 seconds (ref 24–36)

## 2021-09-05 MED ORDER — LORAZEPAM 2 MG/ML IJ SOLN
1.0000 mg | Freq: Once | INTRAMUSCULAR | Status: AC | PRN
  Administered 2021-09-06: 1 mg via INTRAVENOUS
  Filled 2021-09-05: qty 1

## 2021-09-05 MED ORDER — IOHEXOL 350 MG/ML SOLN
40.0000 mL | Freq: Once | INTRAVENOUS | Status: AC | PRN
  Administered 2021-09-05: 40 mL via INTRAVENOUS

## 2021-09-05 MED ORDER — IOHEXOL 350 MG/ML SOLN
75.0000 mL | Freq: Once | INTRAVENOUS | Status: AC | PRN
  Administered 2021-09-05: 75 mL via INTRAVENOUS

## 2021-09-05 MED ORDER — TENECTEPLASE FOR STROKE
PACK | INTRAVENOUS | Status: AC
  Filled 2021-09-05: qty 10

## 2021-09-05 MED ORDER — ASPIRIN 325 MG PO TABS
325.0000 mg | ORAL_TABLET | Freq: Once | ORAL | Status: AC
  Administered 2021-09-05: 325 mg via ORAL

## 2021-09-05 NOTE — ED Triage Notes (Signed)
Developed slurred speech and aphasia around 7 pm this evening.

## 2021-09-05 NOTE — ED Notes (Signed)
Pt in CT at this time. This RN transporting pt.

## 2021-09-05 NOTE — ED Provider Notes (Signed)
[facility] Provider Note   CSN: [id number] Arrival date & time: 09/05/21  1939     History Chief Complaint  Patient presents with   Aphasia    Bernard Young is a 80 y.o. male.  The history is provided by the patient, the spouse and medical records. No language interpreter was used.  Neurologic Problem This is a new problem. The current episode started less than 1 hour ago. The problem occurs constantly. The problem has been resolved. Pertinent negatives include no chest pain, no abdominal pain, no headaches and no shortness of breath. Nothing aggravates the symptoms. Nothing relieves the symptoms. He has tried nothing for the symptoms. The treatment provided no relief.      Past Medical History:  Diagnosis Date   Arthritis    Diverticulosis of colon 05/2017   noted on CT abd/pelvis   Enlarged prostate 06/26/2017   mild , noted on CT abd/pelvis   Headache    Hypertension    Inguinal hernia 05/2017   small to moderate right containing bowel loop of sigmoid colon, noted on CT abd/pelvis   Numbness and tingling in left hand    PONV (postoperative nausea and vomiting)    very very bad nausea but no vomiting : "i get so sick of it and it is just awful ; even with a full stomach i cant vomit "    Renal mass, left    Shoulder pain    right    TIA (transient ischemic attack)    nearly 20 years ago     Patient Active Problem List   Diagnosis Date Noted   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  Social History   Tobacco Use   Smoking status: Never   Smokeless tobacco: Never   Vaping Use   Vaping Use: Never used  Substance Use Topics   Alcohol use: No   Drug use: No    Home Medications Prior to Admission medications   Medication Sig Start Date End Date Taking? Authorizing Provider  allopurinol (ZYLOPRIM) 100 MG tablet Take 100 mg by mouth daily. Patient not taking: Reported on 04/15/2021 09/03/17   [provider]  HYDROcodone-acetaminophen (NORCO) 5-325 MG tablet Take 1-2 tablets by mouth every 6 (six) hours as needed for moderate pain or severe pain. 10/29/17   Debbrah Alar, PA-C  losartan (COZAAR) 25 MG tablet Take 25 mg by mouth daily. 09/03/17   [provider]  ondansetron (ZOFRAN) 4 MG tablet Take 1 tablet (4 mg total) by mouth every 6 (six) hours as needed for nausea or vomiting. 10/31/17   Irine Seal, MD  pantoprazole (PROTONIX) 20 MG tablet Take 20 mg by mouth daily.    [provider]  triamcinolone cream (KENALOG) 0.1 % Apply 1 application topically daily as needed (for psoriasis rash).  05/12/17   [provider]    Allergies    Sulfa antibiotics and Penicillins  Review of Systems   Review of Systems  Constitutional:  Negative for chills, diaphoresis, fatigue and fever.  HENT:  Negative for congestion.  Eyes:  Negative for photophobia and visual disturbance.  Respiratory:  Negative for cough, chest tightness, shortness of breath and wheezing.   Cardiovascular:  Negative for chest pain and palpitations.  Gastrointestinal:  Negative for abdominal pain, constipation, diarrhea, nausea and vomiting.  Genitourinary:  Negative for flank pain.  Musculoskeletal:  Negative for neck pain and neck stiffness.  Skin:  Negative for wound.  Neurological:  Positive for speech difficulty. Negative for facial asymmetry, weakness, light-headedness, numbness and headaches.  Psychiatric/Behavioral:  Negative for agitation and confusion.   All other systems reviewed and are negative.  Physical Exam Updated Vital Signs BP (!)  160/84   Pulse 80   Temp 98.3 F (36.8 C) (Oral)   Resp 15   Ht 5\' 9"  (1.753 m)   Wt 77.1 kg   SpO2 99%   BMI 25.10 kg/m   Physical Exam Vitals and nursing note reviewed.  Constitutional:      General: He is not in acute distress.    Appearance: He is well-developed. He is not ill-appearing, toxic-appearing or diaphoretic.  HENT:     Head: Normocephalic and atraumatic.     Nose: No congestion or rhinorrhea.     Mouth/Throat:     Mouth: Mucous membranes are moist.     Pharynx: No oropharyngeal exudate.  Eyes:     Extraocular Movements: Extraocular movements intact.     Conjunctiva/sclera: Conjunctivae normal.     Pupils: Pupils are equal, round, and reactive to light.  Cardiovascular:     Rate and Rhythm: Normal rate and regular rhythm.     Heart sounds: No murmur heard. Pulmonary:     Effort: Pulmonary effort is normal. No respiratory distress.     Breath sounds: Normal breath sounds. No wheezing, rhonchi or rales.  Chest:     Chest wall: No tenderness.  Abdominal:     General: Abdomen is flat.     Palpations: Abdomen is soft.     Tenderness: There is no abdominal tenderness. There is no guarding or rebound.  Musculoskeletal:        General: No swelling or tenderness.     Cervical back: Neck supple. No tenderness.  Skin:    General: Skin is warm and dry.     Capillary Refill: Capillary refill takes less than 2 seconds.     Findings: No erythema or rash.  Neurological:     Mental Status: He is alert.     Sensory: No sensory deficit.     Motor: No weakness.  Psychiatric:        Mood and Affect: Mood normal.    ED Results / Procedures / Treatments   Labs (all labs ordered are listed, but only abnormal results are displayed) Labs Reviewed  COMPREHENSIVE METABOLIC PANEL - Abnormal; Notable for the following components:      Result Value   Glucose, Bld 103 (*)    All other components within normal limits  RESP PANEL BY RT-PCR (FLU A&B, COVID) ARPGX2  ETHANOL   PROTIME-INR  APTT  CBC  DIFFERENTIAL  RAPID URINE DRUG SCREEN, HOSP PERFORMED  URINALYSIS, ROUTINE W REFLEX MICROSCOPIC  CBG MONITORING, ED    EKG EKG Interpretation  Date/Time:  Thursday September 05 2021 19:59:29 EST Ventricular Rate:  78 PR Interval:  220 QRS Duration: 83 QT Interval:  415 QTC Calculation: 473 R Axis:   56 Text Interpretation: Sinus rhythm Prolonged PR interval When compared to prior, similar appearance. no STEMI Confirmed by Lylian Sanagustin, Gerald Stabs [phone]) on  09/05/2021 11:05:02 PM  Radiology CT HEAD CODE STROKE WO CONTRAST  Result Date: 09/05/2021 CLINICAL DATA:  Code stroke. EXAM: CT HEAD WITHOUT CONTRAST TECHNIQUE: Contiguous axial images were obtained from the base of the skull through the vertex without intravenous contrast. COMPARISON:  Prior study from 09/25/2017. FINDINGS: Brain: Age-related cerebral atrophy with chronic small vessel ischemic disease. No acute intracranial hemorrhage. No acute large vessel territory infarct. No mass lesion, midline shift or mass effect. No hydrocephalus or extra-axial fluid collection. Vascular: No hyperdense vessel. Calcified atherosclerosis present at skull base. Skull: Scalp soft tissues within normal limits.  Calvarium intact. Sinuses/Orbits: Globes orbital soft tissues within normal limits. Scattered mucosal thickening noted within the ethmoidal air cells. Paranasal sinuses and mastoid air cells are otherwise clear. Other: None. ASPECTS [facility] Stroke Program Early CT Score) - Ganglionic level infarction (caudate, lentiform nuclei, internal capsule, insula, M1-M3 cortex): 7 - Supraganglionic infarction (M4-M6 cortex): 3 Total score (0-10 with 10 being normal): 10 IMPRESSION: 1. No acute intracranial infarct or other abnormality. 2. ASPECTS is 10. 3. Age-related cerebral atrophy with chronic small vessel ischemic disease. Critical Value/emergent results were called by telephone at the time of interpretation on 09/05/2021 at 8:36 pm to  provider [facility] , who verbally acknowledged these results. Electronically Signed   By: Jeannine Boga M.D.   On: 09/05/2021 20:39    Procedures Procedures   CRITICAL CARE Performed by: Gwenyth Allegra Chariti Havel Total critical care time: 35 minutes Critical care time was exclusive of separately billable procedures and treating other patients. Critical care was necessary to treat or prevent imminent or life-threatening deterioration. Critical care was time spent personally by me on the following activities: development of treatment plan with patient and/or surrogate as well as nursing, discussions with consultants, evaluation of patient's response to treatment, examination of patient, obtaining history from patient or surrogate, ordering and performing treatments and interventions, ordering and review of laboratory studies, ordering and review of radiographic studies, pulse oximetry and re-evaluation of patient's condition.   Medications Ordered in ED Medications  tenecteplase (TNKASE) 50 MG injection for Stroke (has no administration in time range)  aspirin tablet 325 mg (has no administration in time range)  iohexol (OMNIPAQUE) 350 MG/ML injection 75 mL (75 mLs Intravenous Contrast Given 09/05/21 2055)    ED Course  I have reviewed the triage vital signs and the nursing notes.  Pertinent labs & imaging results that were available during my care of the patient were reviewed by me and considered in my medical decision making (see chart for details).    MDM Rules/Calculators/A&P                           Asar Evilsizer is a 80 y.o. 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  According to patient, at 7 PM tonight, he was with kids and grandchildren when he started having difficulty speaking.  He described as aphasia.  He otherwise  denied other neurological plaints and denied headache or neck pain.  He reports he was feeling normally earlier today and has not had any preceding symptoms.  He denies recent medication changes, trauma, or other complaints.  He denies any vision changes, or any neurologic complaints in his extremities.  Denies dizziness or gait problems.  On my initial evaluation, his speech is clear.  He reports that he is back to  baseline.  Lungs were clear and chest was nontender.  Abdomen nontender.  Normal finger-nose-finger testing bilaterally, normal sensation and strength in extremities.  Patient well-appearing.  As he was back to baseline, we agreed to hold on a code stroke and will get a noncontrast head CT and basic work-up.  Started the process to speak to neurology about a plan.  When I reassessed patient, he was again having aphasia and could not answer any questions for me.  He could barely get out 1 or 2 words in a conversation.  Decision made to activate code stroke.  Code stroke was started it with teleneurology.  Patient had CT imaging including CTA and perfusion study that was reassuring.  There was some possible infundibula bilaterally versus small aneurysms but otherwise no bleeding seen.  Patient returned again to baseline and had an NIH of 0 after so teleneurology not feel that TNKase was needed.  Due to the waxing and waning symptoms with speech difficulties, neurology does feel he needs MRI and admission.  Patient will be admitted for further management at [facility] to see neurology tonight and to get MRI.  Aspirin was given per teleneurology.  Patient will be admitted.    Final Clinical Impression(s) / ED Diagnoses Final diagnoses:  Transient speech disturbance    Clinical Impression: 1. Transient speech disturbance     Disposition: Admit  This note was prepared with assistance of Dragon voice recognition software. Occasional wrong-word or sound-a-like substitutions may have  occurred due to the inherent limitations of voice recognition software.     Tashea Othman, Gwenyth Allegra, MD 09/05/21 [phone]

## 2021-09-05 NOTE — ED Notes (Signed)
Pt resting comfortably at this time with wife at bedside and call bell within reach. No complaints at this time. Pt speaking normally without aphasia.

## 2021-09-05 NOTE — ED Notes (Signed)
Speaking with neurologist at this time

## 2021-09-05 NOTE — ED Notes (Signed)
Pt in CT at this time. Transported by this Therapist, sports.

## 2021-09-05 NOTE — Consult Note (Signed)
[facility] Tele[facility]urology Consult Services   Patient Name:   Bernard Young, Bernard Young Date of Birth:   1941/09/17 Identification Number:   MRN - [id number] Date of Service:   09/05/2021 26:71:24  Diagnosis:       G45.9 - Transient cerebral ischemic attack, unspecified  Impression:      80 y/o man with hx HTN, HLD who presents with waxing & waning aphasia and dysarthria that began at 1900 this pm; NIHSS was 0 at the time of my evaluation, CT/head was negative but advanced imaging ordered for further evaluation, which was negative. See report as below.  Give 325mg  ASA now and then admit for MRI brain, ECHO, and telemetry; see other recommendations as below.  Metrics: Last Known Well: 09/05/2021 19:00:00 [facility] Notification Time: 09/05/2021 58:09:98 Arrival Time: 09/05/2021 19:46:00 Stamp Time: 09/05/2021 33:82:50 Initial Response Time: 09/05/2021 53:97:67 Symptoms: speech and language difficulty . NIHSS Start Assessment Time: 09/05/2021 20:38:12 Patient is not a candidate for Thrombolytic. Thrombolytic Medical Decision: 09/05/2021 21:44:05 Patient was not deemed candidate for Thrombolytic because of following reasons: No disabling symptoms.  I personally Reviewed the CT Head and it Showed no acute ischemia or hemorrhage. wik  IMPRESSION: 1. No acute intracranial infarct or other abnormality. 2. ASPECTS is 10. 3. Age-related cerebral atrophy with chronic small vessel ischemic disease.    Radiologist was not called back for review of advanced imaging. ED Physician notified of diagnostic impression and management plan on 09/05/2021 21:36:26  Advanced Imaging: CTA Head and Neck Completed.  CTP Completed.  LVO:No  Patient doesn't meet criteria for emergent NIR consideration   Our recommendations are outlined below.  Recommendations:        Stroke/Telemetry Floor       Neuro Checks       Bedside Swallow Eval       DVT Prophylaxis       IV Fluids,  Normal Saline       Head of Bed 30 Degrees       Euglycemia and Avoid Hyperthermia (PRN Acetaminophen)       Initiate or continue Aspirin 81 MG daily  Routine Consultation with [facility] for Follow up Care  Sign Out:       Discussed with Emergency Department Provider    ------------------------------------------------------------------------------  History of Present Illness: Patient is a 80 year old Male.  Patient was brought by EMS for symptoms of speech and language difficulty .  80 y/o man with hx of HTN, HLD with waxing and waning aphasia and dysarthria (which was witnessed by his wife and ER doctor) beginning around 1900 this evening. He denies any focal weakness or other physical symptoms. Does not take ASA or blood thinners.    Past Medical History:      Hypertension      Hyperlipidemia  Social History: Smoking: No Alcohol Use: No Drug Use: No  Family History:Unable to obtain due to Patient Status  Review of System:  14 Points Review of Systems was performed and was negative except mentioned in HPI.  No Anticoagulant use   No Antiplatelet use  Allergies:  Reviewed Allergies Text: PCN, sulfa, statins     Examination: BP(160/84), Pulse(82), Blood Glucose(103) 1A: Level of Consciousness - Alert; keenly responsive + 0 1B: Ask Month and Age - Both Questions Right + 0 1C: Blink Eyes & Squeeze Hands - Performs Both Tasks + 0 2: Test Horizontal Extraocular Movements - Normal + 0 3: Test Visual Fields - No Visual Loss + 0 4: Test  Facial Palsy (Use Grimace if Obtunded) - Normal symmetry + 0 5A: Test Left Arm Motor Drift - No Drift for 10 Seconds + 0 5B: Test Right Arm Motor Drift - No Drift for 10 Seconds + 0 6A: Test Left Leg Motor Drift - No Drift for 5 Seconds + 0 6B: Test Right Leg Motor Drift - No Drift for 5 Seconds + 0 7: Test Limb Ataxia (FNF/Heel-Shin) - No Ataxia + 0 8: Test Sensation - Normal; No sensory loss + 0 9: Test Language/Aphasia -  Severe Aphasia: Fragmentary Expression, Inference Needed, Cannot Identify Materials + 2 10: Test Dysarthria - Mild-Moderate Dysarthria: Slurring but can be understood + 1 11: Test Extinction/Inattention - No abnormality + 0  NIHSS Score: 3  NIHSS Free Text : artificial hip on L limited HTS, o/w WNL  Pre-Morbid Modified Rankin Scale: 0 Points = No symptoms at all   Patient/Family was informed the Neurology Consult would occur via TeleHealth consult by way of interactive audio and video telecommunications and consented to receiving care in this manner.   Patient is being evaluated for possible acute neurologic impairment and high probability of imminent or life-threatening deterioration. I spent total of 40 minutes providing care to this patient, including time for face to face visit via telemedicine, review of medical records, imaging studies and discussion of findings with providers, the patient and/or family.   Dr Darrick Meigs   [facility] [phone]  Case [id number]

## 2021-09-05 NOTE — ED Notes (Signed)
MD requesting hold on the aspirin at this time until plan of care is determined with neurology for decision of administration of TNK.

## 2021-09-06 ENCOUNTER — Observation Stay (HOSPITAL_COMMUNITY): Payer: PPO

## 2021-09-06 ENCOUNTER — Encounter (HOSPITAL_COMMUNITY): Payer: Self-pay | Admitting: Internal Medicine

## 2021-09-06 DIAGNOSIS — R471 Dysarthria and anarthria: Secondary | ICD-10-CM | POA: Diagnosis not present

## 2021-09-06 DIAGNOSIS — R4701 Aphasia: Secondary | ICD-10-CM

## 2021-09-06 DIAGNOSIS — Z79899 Other long term (current) drug therapy: Secondary | ICD-10-CM | POA: Diagnosis not present

## 2021-09-06 DIAGNOSIS — G459 Transient cerebral ischemic attack, unspecified: Secondary | ICD-10-CM | POA: Diagnosis not present

## 2021-09-06 DIAGNOSIS — I10 Essential (primary) hypertension: Secondary | ICD-10-CM

## 2021-09-06 DIAGNOSIS — G319 Degenerative disease of nervous system, unspecified: Secondary | ICD-10-CM | POA: Diagnosis not present

## 2021-09-06 DIAGNOSIS — R479 Unspecified speech disturbances: Secondary | ICD-10-CM | POA: Diagnosis not present

## 2021-09-06 DIAGNOSIS — Y900 Blood alcohol level of less than 20 mg/100 ml: Secondary | ICD-10-CM | POA: Diagnosis not present

## 2021-09-06 DIAGNOSIS — K219 Gastro-esophageal reflux disease without esophagitis: Secondary | ICD-10-CM

## 2021-09-06 DIAGNOSIS — Z20822 Contact with and (suspected) exposure to covid-19: Secondary | ICD-10-CM | POA: Diagnosis not present

## 2021-09-06 DIAGNOSIS — E041 Nontoxic single thyroid nodule: Secondary | ICD-10-CM

## 2021-09-06 DIAGNOSIS — M109 Gout, unspecified: Secondary | ICD-10-CM

## 2021-09-06 MED ORDER — ENOXAPARIN SODIUM 40 MG/0.4ML IJ SOSY
40.0000 mg | PREFILLED_SYRINGE | Freq: Every day | INTRAMUSCULAR | Status: DC
  Administered 2021-09-07: 40 mg via SUBCUTANEOUS
  Filled 2021-09-06: qty 0.4

## 2021-09-06 MED ORDER — ACETAMINOPHEN 160 MG/5ML PO SOLN: 650.0000 mg | ORAL | Status: DC | PRN

## 2021-09-06 MED ORDER — STROKE: EARLY STAGES OF RECOVERY BOOK
Freq: Once | Status: AC
  Filled 2021-09-06: qty 1

## 2021-09-06 MED ORDER — ASPIRIN EC 81 MG PO TBEC
81.0000 mg | DELAYED_RELEASE_TABLET | Freq: Every day | ORAL | Status: DC
Start: 2021-09-07 — End: 2021-09-07
  Administered 2021-09-07: 81 mg via ORAL
  Filled 2021-09-06: qty 1

## 2021-09-06 MED ORDER — ACETAMINOPHEN 650 MG RE SUPP: 650.0000 mg | RECTAL | Status: DC | PRN

## 2021-09-06 MED ORDER — ACETAMINOPHEN 325 MG PO TABS: 650.0000 mg | ORAL_TABLET | ORAL | Status: DC | PRN

## 2021-09-06 NOTE — ED Notes (Signed)
Called [facility] to advise that patient's bed is ready, [facility] [phone]

## 2021-09-06 NOTE — Progress Notes (Signed)
Patient arrived to room 3W29 in NAD, VS stable and patient free from pain. Patient oriented to room and call bell in reach.

## 2021-09-06 NOTE — H&P (Signed)
History and Physical    Bernard Young IDP:[id number] DOB: August 05, 1941 DOA: 09/05/2021  PCP: Vernie Shanks, MD Patient coming from: [place] [facility]  Chief Complaint: Aphasia  HPI: Bernard Young is a 80 y.o. male with medical history significant of hypertension, TIA, gout, GERD, renal neoplasm status post left nephrectomy presented to the ED on 12/8 with waxing and waning aphasia and dysarthria beginning around 1900 in the evening.  On arrival to the ED, his speech was clear but later he started having aphasia again and code stroke was activated.  Telemetry neurology was consulted.  NIH was 0 at the time of evaluation by neurology.  CT head negative for acute stroke.  CTA head and neck/perfusion study negative for LVO.  Showing 2 mm outpouchings arising from the supraclinoid ICAs bilaterally, favored to reflect vascular infundibula, although small aneurysms difficult to exclude.  No significant metabolic derangements on labs.  Neurology recommended admission for TIA work-up.  Patient was given aspirin 325 mg.  Patient states he was with family yesterday evening around 7 PM when he had an episode where he could think what he had to say but could not get the right words out.  No focal weakness or numbness.  Symptoms resolved by the time he reached the emergency room but then he had another episode again.  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  He is not on aspirin.  No other complaints.  Denies fevers, cough, shortness of breath, chest pain, nausea, vomiting, abdominal pain, or diarrhea.  Review of Systems:  All systems reviewed and apart from history of presenting illness, are negative.  Past Medical History:  Diagnosis Date   Arthritis    Diverticulosis of colon 05/2017   noted on CT abd/pelvis   Enlarged prostate 06/26/2017   mild , noted on CT abd/pelvis   Headache    Hypertension     Inguinal hernia 05/2017   small to moderate right containing bowel loop of sigmoid colon, noted on CT abd/pelvis   Numbness and tingling in left hand    PONV (postoperative nausea and vomiting)    very very bad nausea but no vomiting : "i get so sick of it and it is just awful ; even with a full stomach i cant vomit "    Renal mass, left    Shoulder pain    right    TIA (transient ischemic attack)    nearly 20 years ago     Past Surgical History:  Procedure Laterality Date   JOINT REPLACEMENT Left    left knee arthoscopy   2001   ROBOT ASSISTED LAPAROSCOPIC NEPHRECTOMY Left 10/29/2017   Procedure: XI ROBOTIC ASSISTED LAPAROSCOPIC / PARTIAL NEPHRECTOMY;  Surgeon: Raynelle Bring, MD;  Location: [facility];  Service: Urology;  Laterality: Left;  NEEDS 210 MIN     reports that he has never smoked. He has never used smokeless tobacco. He reports that he does not drink alcohol and does not use drugs.  Allergies  Allergen Reactions   Sulfa Antibiotics Other (See Comments)    Unknown childhood reaction   Penicillins Rash    Has patient had a PCN reaction causing immediate rash, facial/tongue/throat swelling, SOB or lightheadedness with hypotension: No Has patient had a PCN reaction causing severe rash involving mucus membranes or skin necrosis: No Has patient had a PCN reaction that required hospitalization: No  Has patient had a PCN reaction occurring within the last 10 years: No If all of the above answers are "NO", then may proceed with Cephalosporin use.    History reviewed. No pertinent family history.  Prior to Admission medications   Medication Sig Start Date End Date Taking? Authorizing Provider  allopurinol (ZYLOPRIM) 100 MG tablet Take 100 mg by mouth daily. Patient not taking: Reported on 04/15/2021 09/03/17   [provider]  HYDROcodone-acetaminophen (NORCO) 5-325 MG tablet Take 1-2 tablets by mouth every 6 (six) hours as needed for moderate pain or severe pain.  10/29/17   Debbrah Alar, PA-C  losartan (COZAAR) 25 MG tablet Take 25 mg by mouth daily. 09/03/17   [provider]  ondansetron (ZOFRAN) 4 MG tablet Take 1 tablet (4 mg total) by mouth every 6 (six) hours as needed for nausea or vomiting. 10/31/17   Irine Seal, MD  pantoprazole (PROTONIX) 20 MG tablet Take 20 mg by mouth daily.    [provider]  triamcinolone cream (KENALOG) 0.1 % Apply 1 application topically daily as needed (for psoriasis rash).  05/12/17   [provider]    Physical Exam: Vitals:   09/06/21 1737 09/06/21 1826 09/06/21 1827 09/06/21 1949  BP: (!) 168/93 (!) 185/86 (!) 185/86 (!) 175/87  Pulse: 71 69  67  Resp: 18 18  18   Temp:  98.3 F (36.8 C)  97.8 F (36.6 C)  TempSrc:  Oral  Oral  SpO2: 93% 98%  97%  Weight:      Height:        Physical Exam Constitutional:      General: He is not in acute distress. HENT:     Head: Normocephalic and atraumatic.  Eyes:     Extraocular Movements: Extraocular movements intact.     Conjunctiva/sclera: Conjunctivae normal.  Cardiovascular:     Rate and Rhythm: Normal rate and regular rhythm.     Pulses: Normal pulses.  Pulmonary:     Effort: Pulmonary effort is normal. No respiratory distress.     Breath sounds: Normal breath sounds. No wheezing or rales.  Abdominal:     General: Bowel sounds are normal. There is no distension.     Palpations: Abdomen is soft.     Tenderness: There is no abdominal tenderness.  Musculoskeletal:        General: No swelling or tenderness.     Cervical back: Normal range of motion and neck supple.  Skin:    General: Skin is warm and dry.  Neurological:     General: No focal deficit present.     Mental Status: He is alert and oriented to person, place, and time.     Cranial Nerves: No cranial nerve deficit.     Sensory: No sensory deficit.     Motor: No weakness.     Labs on Admission: I have personally reviewed following labs and imaging  studies  CBC: Recent Labs  Lab 09/05/21 1956  WBC 6.9  NEUTROABS 3.6  HGB 13.3  HCT 40.7  MCV 90.6  PLT 400   Basic Metabolic Panel: Recent Labs  Lab 09/05/21 1956  NA 141  K 4.3  CL 102  CO2 31  GLUCOSE 103*  BUN 20  CREATININE 1.02  CALCIUM 9.7   GFR: Estimated Creatinine Clearance: 57.8 mL/min (by C-G formula based on SCr of 1.02 mg/dL). Liver Function Tests: Recent Labs  Lab 09/05/21 1956  AST 17  ALT 14  ALKPHOS 59  BILITOT  0.3  PROT 7.0  ALBUMIN 4.4   No results for input(s): LIPASE, AMYLASE in the last 168 hours. No results for input(s): AMMONIA in the last 168 hours. Coagulation Profile: Recent Labs  Lab 09/05/21 1956  INR 0.9   Cardiac Enzymes: No results for input(s): CKTOTAL, CKMB, CKMBINDEX, TROPONINI in the last 168 hours. BNP (last 3 results) No results for input(s): PROBNP in the last 8760 hours. HbA1C: No results for input(s): HGBA1C in the last 72 hours. CBG: Recent Labs  Lab 09/05/21 2001  GLUCAP 94   Lipid Profile: No results for input(s): CHOL, HDL, LDLCALC, TRIG, CHOLHDL, LDLDIRECT in the last 72 hours. Thyroid Function Tests: No results for input(s): TSH, T4TOTAL, FREET4, T3FREE, THYROIDAB in the last 72 hours. Anemia Panel: No results for input(s): VITAMINB12, FOLATE, FERRITIN, TIBC, IRON, RETICCTPCT in the last 72 hours. Urine analysis:    Component Value Date/Time   COLORURINE YELLOW 09/05/2021 2158   APPEARANCEUR CLEAR 09/05/2021 2158   LABSPEC 1.016 09/05/2021 2158   PHURINE 7.0 09/05/2021 2158   GLUCOSEU NEGATIVE 09/05/2021 2158   HGBUR NEGATIVE 09/05/2021 2158   BILIRUBINUR NEGATIVE 09/05/2021 2158   KETONESUR NEGATIVE 09/05/2021 2158   PROTEINUR NEGATIVE 09/05/2021 2158   NITRITE NEGATIVE 09/05/2021 2158   LEUKOCYTESUR NEGATIVE 09/05/2021 2158    Radiological Exams on Admission: CT HEAD CODE STROKE WO CONTRAST  Result Date: 09/05/2021 CLINICAL DATA:  Code stroke. EXAM: CT HEAD WITHOUT CONTRAST TECHNIQUE:  Contiguous axial images were obtained from the base of the skull through the vertex without intravenous contrast. COMPARISON:  Prior study from 09/25/2017. FINDINGS: Brain: Age-related cerebral atrophy with chronic small vessel ischemic disease. No acute intracranial hemorrhage. No acute large vessel territory infarct. No mass lesion, midline shift or mass effect. No hydrocephalus or extra-axial fluid collection. Vascular: No hyperdense vessel. Calcified atherosclerosis present at skull base. Skull: Scalp soft tissues within normal limits.  Calvarium intact. Sinuses/Orbits: Globes orbital soft tissues within normal limits. Scattered mucosal thickening noted within the ethmoidal air cells. Paranasal sinuses and mastoid air cells are otherwise clear. Other: None. ASPECTS [facility] Stroke Program Early CT Score) - Ganglionic level infarction (caudate, lentiform nuclei, internal capsule, insula, M1-M3 cortex): 7 - Supraganglionic infarction (M4-M6 cortex): 3 Total score (0-10 with 10 being normal): 10 IMPRESSION: 1. No acute intracranial infarct or other abnormality. 2. ASPECTS is 10. 3. Age-related cerebral atrophy with chronic small vessel ischemic disease. Critical Value/emergent results were called by telephone at the time of interpretation on 09/05/2021 at 8:36 pm to provider [facility] , who verbally acknowledged these results. Electronically Signed   By: Jeannine Boga M.D.   On: 09/05/2021 20:39   CT ANGIO HEAD NECK W WO CM W PERF (CODE STROKE)  Result Date: 09/05/2021 CLINICAL DATA:  / EXAM: CT ANGIOGRAPHY HEAD AND NECK TECHNIQUE: Multidetector CT imaging of the head and neck was performed using the standard protocol during bolus administration of intravenous contrast. Multiplanar CT image reconstructions and MIPs were obtained to evaluate the vascular anatomy. Carotid stenosis measurements (when applicable) are obtained utilizing NASCET criteria, using the distal internal carotid diameter as  the denominator. CONTRAST:  63mL OMNIPAQUE IOHEXOL 350 MG/ML SOLN, 51mL OMNIPAQUE IOHEXOL 350 MG/ML SOLN COMPARISON:  Prior head CT from earlier the same day. FINDINGS: CTA NECK FINDINGS Aortic arch: Visualized aortic arch normal caliber with normal branch pattern. Mild atheromatous change about the arch itself. No stenosis about the origin the great vessels. Right carotid system: Right CCA patent to the bifurcation without stenosis. Scattered  mixed plaque about the right carotid bulb/proximal right ICA without hemodynamically significant stenosis. Right ICA patent distally without stenosis, dissection or occlusion. Left carotid system: Left CCA patent without stenosis. Mild plaque about the origin of the cervical left ICA without significant stenosis. Left ICA patent distally without stenosis, dissection or occlusion. Vertebral arteries: Both vertebral arteries arise from subclavian arteries. No proximal subclavian artery stenosis. Dominant left vertebral artery with diffusely hypoplastic right vertebral artery. Vertebral arteries patent without stenosis, dissection or occlusion. Skeleton: No discrete or worrisome osseous lesions. Mild to moderate multilevel cervical spondylosis, most pronounced at C5-6 and C6-7. Mild chronic wedging deformity noted at the superior endplates of T2 and T3. No visible acute osseous finding. Other neck: No other acute soft tissue abnormality within the neck. 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 1.6 cm right thyroid nodule (series 4, image 48), indeterminate. No other mass or adenopathy. Upper chest: Visualized upper chest demonstrates no acute finding. Review of the MIP images confirms the above findings CTA HEAD FINDINGS Anterior circulation: Petrous segments patent bilaterally. Scattered atheromatous change within the carotid siphons with no more than mild multifocal narrowing. 2 mm outpouching arising from the supraclinoid right ICA favored to reflect a vascular  infundibulum, although small aneurysm difficult to exclude (series 8, image 104). A similar 2 mm outpouching seen at the contralateral supraclinoid left ICA (series 8, image 128), also favored to reflect a small vascular infundibulum, although aneurysm again difficult to exclude. A1 segments patent bilaterally. Normal anterior communicating artery complex. Anterior cerebral arteries patent without stenosis. No M1 stenosis or occlusion. Normal MCA bifurcations. Distal MCA branches perfused and symmetric. Posterior circulation: Dominant left vertebral artery widely patent to the vertebrobasilar junction. Hypoplastic right vertebral artery patent as well. Neither PICA origin well visualized. Basilar patent to its distal aspect without stenosis. Superior cerebellar arteries patent bilaterally. Both PCAs primarily supplied via the basilar. PCAs patent to their distal aspects without flow-limiting stenosis. Venous sinuses: Patent allowing for timing the contrast bolus. Anatomic variants: Strongly dominant left vertebral artery with diffusely hypoplastic right vertebral artery. No aneurysm. Review of the MIP images confirms the above findings IMPRESSION: 1. Negative CTA for large vessel occlusion. 2. Mild atherosclerotic change about the carotid bifurcations and carotid siphons without hemodynamically significant stenosis. 3. 2 mm outpouchings arising from the supraclinoid ICAs bilaterally, favored to reflect vascular infundibula, although small aneurysms difficult to exclude. Attention at follow-up recommended. 4. 1.6 cm right thyroid nodule, indeterminate. Further evaluation with dedicated thyroid ultrasound recommended. This could be performed on a nonemergent outpatient basis. (ref: J Am Coll Radiol. 2015 Feb;12(2): 143-50). Electronically Signed   By: Jeannine Boga M.D.   On: 09/05/2021 21:59    EKG: Independently reviewed. Sinus rhythm with first-degree AV block.  No significant change since prior  tracing.  Assessment/Plan Principal Problem:   TIA (transient ischemic attack) Active Problems:   Thyroid nodule   HTN (hypertension)   Gout   GERD (gastroesophageal reflux disease)   TIA Patient presented to the ED with waxing and waning aphasia and dysarthria beginning around 7 PM yesterday.  On arrival to the ED, his speech was clear but later he started having aphasia again and code stroke was activated.  Telemetry neurology was consulted.  NIH was 0 at the time of evaluation by neurology.  CT head negative for acute stroke.  CTA head and neck/perfusion study negative for LVO.  Showing 2 mm outpouchings arising from the supraclinoid ICAs bilaterally, favored to reflect vascular infundibula,  although small aneurysms difficult to exclude.  Neurology recommended admission for TIA work-up.  His speech is fluent at this time and he does not have any focal neurodeficits. -I have consulted neurology (Dr. Leonel Ramsay) at [facility]. -Telemetry monitoring -MRI of the brain without contrast -TTE -Hemoglobin A1c, fasting lipid panel -Patient was given aspirin 325 mg in the ED yesterday.  Continue aspirin 81 mg daily per neurology recommendations. -Frequent neurochecks -PT, OT, speech therapy. -N.p.o. until cleared by bedside swallow evaluation or formal speech evaluation   Thyroid nodule CT showing 1.6 cm right thyroid nodule, indeterminate. -Thyroid ultrasound ordered for further evaluation.  Hypertension -Allow permissive hypertension at this time.  Brain MRI pending.  Gout GERD Stable.  -Pharmacy med rec pending.  DVT prophylaxis: Lovenox Code Status: Patient wishes to be full code. Family Communication: No family available at this time. Disposition Plan: Status is: Observation  The patient remains OBS appropriate and will d/c before 2 midnights.  Level of care: Level of care: Telemetry Medical  The medical decision making on this patient was of high complexity and  the patient is at high risk for clinical deterioration, therefore this is a level 3 visit.  Shela Leff MD [facility]  If 7PM-7AM, please contact night-coverage [email or web address]  09/06/2021, 9:48 PM

## 2021-09-06 NOTE — Plan of Care (Signed)
  Problem: Education: Goal: Knowledge of General Education information will improve Description: Including pain rating scale, medication(s)/side effects and non-pharmacologic comfort measures Outcome: Progressing   Problem: Health Behavior/Discharge Planning: Goal: Ability to manage health-related needs will improve Outcome: Progressing   Problem: Clinical Measurements: Goal: Ability to maintain clinical measurements within normal limits will improve Outcome: Progressing Goal: Will remain free from infection Outcome: Progressing Goal: Diagnostic test results will improve Outcome: Progressing Goal: Respiratory complications will improve Outcome: Progressing Goal: Cardiovascular complication will be avoided Outcome: Progressing   Problem: Activity: Goal: Risk for activity intolerance will decrease Outcome: Progressing   Problem: Nutrition: Goal: Adequate nutrition will be maintained Outcome: Progressing   Problem: Coping: Goal: Level of anxiety will decrease Outcome: Progressing   Problem: Elimination: Goal: Will not experience complications related to bowel motility Outcome: Progressing Goal: Will not experience complications related to urinary retention Outcome: Progressing   Problem: Pain Managment: Goal: General experience of comfort will improve Outcome: Progressing   Problem: Safety: Goal: Ability to remain free from injury will improve Outcome: Progressing   Problem: Skin Integrity: Goal: Risk for impaired skin integrity will decrease Outcome: Progressing   Problem: Education: Goal: Knowledge of disease or condition will improve Outcome: Progressing Goal: Knowledge of secondary prevention will improve (SELECT ALL) Outcome: Progressing Goal: Knowledge of patient specific risk factors will improve (INDIVIDUALIZE FOR PATIENT) Outcome: Progressing Goal: Individualized Educational Video(s) Outcome: Progressing   Problem: Coping: Goal: Will verbalize  positive feelings about self Outcome: Progressing Goal: Will identify appropriate support needs Outcome: Progressing   Problem: Health Behavior/Discharge Planning: Goal: Ability to manage health-related needs will improve Outcome: Progressing   Problem: Self-Care: Goal: Ability to participate in self-care as condition permits will improve Outcome: Progressing Goal: Ability to communicate needs accurately will improve Outcome: Progressing   Problem: Ischemic Stroke/TIA Tissue Perfusion: Goal: Complications of ischemic stroke/TIA will be minimized Outcome: Progressing

## 2021-09-07 ENCOUNTER — Observation Stay (HOSPITAL_BASED_OUTPATIENT_CLINIC_OR_DEPARTMENT_OTHER): Payer: PPO

## 2021-09-07 DIAGNOSIS — E041 Nontoxic single thyroid nodule: Secondary | ICD-10-CM | POA: Diagnosis not present

## 2021-09-07 DIAGNOSIS — G459 Transient cerebral ischemic attack, unspecified: Secondary | ICD-10-CM

## 2021-09-07 DIAGNOSIS — I69322 Dysarthria following cerebral infarction: Secondary | ICD-10-CM | POA: Diagnosis not present

## 2021-09-07 DIAGNOSIS — R4701 Aphasia: Secondary | ICD-10-CM | POA: Diagnosis not present

## 2021-09-07 LAB — ECHOCARDIOGRAM COMPLETE
AR max vel: 3.63 cm2
AV Area VTI: 4.14 cm2
AV Area mean vel: 3.53 cm2
AV Mean grad: 2 mmHg
AV Peak grad: 4.2 mmHg
Ao pk vel: 1.03 m/s
Area-P 1/2: 2.38 cm2
Height: 69 in
MV M vel: 1.04 m/s
MV Peak grad: 4.3 mmHg
S' Lateral: 2.5 cm
Weight: 2720 oz

## 2021-09-07 LAB — LIPID PANEL
Cholesterol: 174 mg/dL (ref 0–200)
HDL: 49 mg/dL (ref >40)
LDL Cholesterol: 110 mg/dL — ABNORMAL HIGH (ref 0–99)
Total CHOL/HDL Ratio: 3.6 RATIO
Triglycerides: 74 mg/dL (ref <150)
VLDL: 15 mg/dL (ref 0–40)

## 2021-09-07 LAB — HEMOGLOBIN A1C
Hgb A1c MFr Bld: 5.6 % (ref 4.8–5.6)
Mean Plasma Glucose: 114.02 mg/dL

## 2021-09-07 LAB — TSH: TSH: 0.399 u[IU]/mL (ref 0.350–4.500)

## 2021-09-07 MED ORDER — CLOPIDOGREL BISULFATE 75 MG PO TABS: 75.0000 mg | ORAL_TABLET | Freq: Every day | ORAL | Status: DC

## 2021-09-07 MED ORDER — ASPIRIN 81 MG PO TBEC: 81.0000 mg | DELAYED_RELEASE_TABLET | Freq: Every day | ORAL | 11 refills | Status: DC

## 2021-09-07 MED ORDER — CLOPIDOGREL BISULFATE 300 MG PO TABS
300.0000 mg | ORAL_TABLET | Freq: Once | ORAL | Status: AC
  Administered 2021-09-07: 300 mg via ORAL
  Filled 2021-09-07: qty 1

## 2021-09-07 MED ORDER — ATORVASTATIN CALCIUM 40 MG PO TABS: 40.0000 mg | ORAL_TABLET | Freq: Every day | ORAL | 2 refills | Status: AC

## 2021-09-07 MED ORDER — CLOPIDOGREL BISULFATE 75 MG PO TABS: 75.0000 mg | ORAL_TABLET | Freq: Every day | ORAL | 0 refills | Status: AC

## 2021-09-07 MED ORDER — ATORVASTATIN CALCIUM 40 MG PO TABS
40.0000 mg | ORAL_TABLET | Freq: Every day | ORAL | Status: DC
  Administered 2021-09-07: 40 mg via ORAL
  Filled 2021-09-07: qty 1

## 2021-09-07 NOTE — Discharge Summary (Signed)
[facility]  Physician Discharge Summary   Patient ID: Bernard Young MRN: [id number] DOB/AGE: 12-10-1940 80 y.o.  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 male with medical history significant of hypertension, TIA, gout, GERD, renal neoplasm status post left nephrectomy presented to the ED on 12/8 with waxing and waning aphasia and dysarthria beginning around 1900 in the evening.  On arrival to the ED, his speech was clear but later he started having aphasia again and code stroke was activated. Tele-neurology was consulted.  NIH was 0 at the time of evaluation by neurology.  CT head negative for acute stroke.  CTA head and neck/perfusion study negative for LVO.  Showing 2 mm outpouchings arising from the supraclinoid ICAs bilaterally, favored to reflect vascular infundibula, although small aneurysms difficult to exclude.  Patient was hospitalized for TIA work-up.      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  Symptoms have resolved. LDL 110.  HbA1c 5.6.  TSH 0.39.  Urine drug screen negative.  EKG did not show any atrial fibrillation Seen with Occupational Therapy and speech therapy.  No needs identified. Echocardiogram shows normal systolic function.  No embolic source identified.  Atrial septum could not be visualized Patient is currently on aspirin and Plavix. Started on atorvastatin. CT angiogram also showed 2 mm outpouching from the  supraclinoid ICAs bilaterally favored to reflect vascular infundibulum although small aneurysms cannot be excluded.  Recommend outpatient follow-up with neurology for same. Cleared by neurology for discharge.   Thyroid nodule Right thyroid nodule was noted incidentally on CT scan.  Ultrasound was done.  Recommendation is to repeat imaging study in 1 year.  TSH is normal.  Essential hypertension Resume home medications  History of gout On allopurinol  Patient is stable.  Okay for discharge home today.   PERTINENT LABS:  The results of significant diagnostics from this hospitalization (including imaging, microbiology, ancillary and laboratory) are listed below for reference.    Microbiology: Recent Results (from the past 240 hour(s))  Resp Panel by RT-PCR (Flu A&B, Covid) Nasopharyngeal Swab     Status: None   Collection Time: 09/05/21  8:06 PM   Specimen: Nasopharyngeal Swab; Nasopharyngeal(NP) swabs in vial transport medium  Result Value Ref Range Status   SARS Coronavirus 2 by RT PCR NEGATIVE NEGATIVE Final    Comment: (NOTE) SARS-CoV-2 target nucleic acids are NOT DETECTED.  The SARS-CoV-2 RNA is generally detectable in upper respiratory specimens during the acute phase of infection. The lowest concentration of SARS-CoV-2 viral copies this assay can detect is 138 copies/mL. A negative result does not preclude SARS-Cov-2 infection and should not be used as the sole basis for treatment or other patient management decisions. A negative result may occur with  improper specimen collection/handling, submission of specimen other than nasopharyngeal swab, presence of viral mutation(s) within the areas targeted by this assay, and inadequate number of viral copies(<138 copies/mL). A negative result must be combined with clinical observations, patient history, and epidemiological information. The expected result is Negative.  Fact Sheet for Patients:   EntrepreneurPulse.com.au  Fact Sheet for Healthcare Providers:  IncredibleEmployment.be  This test is no t yet approved or cleared by the [place] FDA and  has been authorized for detection and/or diagnosis of SARS-CoV-2 by FDA under an Emergency Use Authorization (EUA). This EUA will remain  in effect (meaning this test can be used) for the duration of the COVID-19 declaration under Section 564(b)(1) of the Act, 21 U.S.C.section 360bbb-3(b)(1), unless the authorization is terminated  or revoked sooner.       Influenza A by PCR NEGATIVE NEGATIVE Final   Influenza B by PCR NEGATIVE NEGATIVE Final    Comment: (NOTE) The Xpert Xpress SARS-CoV-2/FLU/RSV plus assay is intended as an aid in the diagnosis of influenza from Nasopharyngeal swab specimens and should not be used as a sole basis for treatment. Nasal washings and aspirates are unacceptable for Xpert Xpress SARS-CoV-2/FLU/RSV testing.  Fact Sheet for Patients: EntrepreneurPulse.com.au  Fact Sheet for Healthcare Providers: IncredibleEmployment.be  This test is not yet approved or cleared by the [place] FDA and has been authorized for detection and/or diagnosis of SARS-CoV-2 by FDA under an Emergency Use Authorization (EUA). This EUA will remain in effect (meaning this test can be used) for the duration of the COVID-19 declaration under Section 564(b)(1) of the Act, 21 U.S.C. section 360bbb-3(b)(1), unless the authorization is terminated or revoked.  Performed at [facility], [address] [place],  [address]      Labs:  COVID-19 Labs   Lab Results  Component Value Date   [place] 09/05/2021      Basic Metabolic Panel: Recent Labs  Lab 09/05/21 1956  NA 141  K 4.3  CL 102  CO2 31  GLUCOSE 103*  BUN 20  CREATININE 1.02  CALCIUM 9.7   Liver Function Tests: Recent Labs  Lab  09/05/21 1956  AST 17  ALT 14  ALKPHOS 59  BILITOT 0.3  PROT 7.0  ALBUMIN 4.4    CBC: Recent Labs  Lab 09/05/21 1956  WBC 6.9  NEUTROABS 3.6  HGB 13.3  HCT 40.7  MCV 90.6  PLT 227     CBG: Recent Labs  Lab 09/05/21 2001  GLUCAP 94     IMAGING STUDIES MR BRAIN WO CONTRAST  Result Date: 09/07/2021 CLINICAL DATA:  Initial evaluation for acute aphasia, slurred speech. TIA versus stroke. EXAM: MRI HEAD WITHOUT CONTRAST TECHNIQUE: Multiplanar, multiecho pulse sequences of the brain and surrounding structures were obtained without intravenous contrast. COMPARISON:  Prior CTs from 09/05/2021. FINDINGS: Brain: Cerebral volume within normal limits for age. Scattered patchy T2/FLAIR hyperintensity involving the periventricular and deep white matter both cerebral hemispheres most consistent with chronic small vessel ischemic disease, mild in nature. No abnormal foci of restricted diffusion to suggest acute or subacute ischemia. Gray-white matter differentiation maintained. No encephalomalacia to suggest chronic cortical infarction. No evidence for acute or chronic intracranial hemorrhage. No mass lesion, midline shift or mass effect. No hydrocephalus or extra-axial fluid collection. Pituitary gland suprasellar region within normal limits. Midline structures intact. Vascular: Major intracranial vascular flow voids are maintained. Skull and upper cervical spine: Craniocervical junction within normal limits. Bone marrow signal intensity normal. No scalp soft tissue abnormality. Sinuses/Orbits: Patient status post bilateral ocular lens replacement. Globes orbital soft tissues demonstrate no acute finding. Scattered mucosal thickening noted within the ethmoidal air cells and maxillary sinuses. No mastoid effusion. Other: Asymmetric atrophy involving the partially visualized right parotid gland noted. IMPRESSION: 1. No acute intracranial  infarct or other abnormality. 2. Mild chronic microvascular  ischemic disease for age. Electronically Signed   By: Jeannine Boga M.D.   On: 09/07/2021 00:00   ECHOCARDIOGRAM COMPLETE  Result Date: 09/07/2021    ECHOCARDIOGRAM REPORT   Patient Name:   PHILLIPPE ORLICK Date of Exam: 09/07/2021 Medical Rec #:  [id number]      Height:       69.0 in Accession #:    [id number]     Weight:       170.0 lb Date of Birth:  04-23-1941      BSA:          1.928 m Patient Age:    46 years       BP:           178/81 mmHg Patient Gender: M              HR:           74 bpm. Exam Location:  Inpatient Procedure: 2D Echo Indications:    TIA  History:        Patient has no prior history of Echocardiogram examinations.                 Risk Factors:Hypertension.  Sonographer:    Maudry Mayhew MHA, RDMS, RVT, RDCS Referring Phys: [id number] [facility]  Sonographer Comments: Image acquisition challenging due to respiratory motion. IMPRESSIONS  1. Left ventricular ejection fraction, by estimation, is 60 to 65%. The left ventricle has normal function. The left ventricle has no regional wall motion abnormalities. Left ventricular diastolic parameters were normal.  2. Right ventricular systolic function is normal. The right ventricular size is normal.  3. The mitral valve is normal in structure. Trivial mitral valve regurgitation. No evidence of mitral stenosis.  4. The aortic valve is tricuspid. There is mild calcification of the aortic valve. Aortic valve regurgitation is trivial. Aortic valve sclerosis/calcification is present, without any evidence of aortic stenosis.  5. The inferior vena cava is normal in size with greater than 50% respiratory variability, suggesting right atrial pressure of 3 mmHg. FINDINGS  Left Ventricle: Left ventricular ejection fraction, by estimation, is 60 to 65%. The left ventricle has normal function. The left ventricle has no regional wall motion abnormalities. The left ventricular internal cavity size was normal in size. There is  no left ventricular  hypertrophy. Left ventricular diastolic parameters were normal. Right Ventricle: The right ventricular size is normal. No increase in right ventricular wall thickness. Right ventricular systolic function is normal. Left Atrium: Left atrial size was normal in size. Right Atrium: Right atrial size was normal in size. Pericardium: There is no evidence of pericardial effusion. Mitral Valve: The mitral valve is normal in structure. Trivial mitral valve regurgitation. No evidence of mitral valve stenosis. Tricuspid Valve: The tricuspid valve is normal in structure. Tricuspid valve regurgitation is not demonstrated. No evidence of tricuspid stenosis. Aortic Valve: The aortic valve is tricuspid. There is mild calcification of the aortic valve. Aortic valve regurgitation is trivial. Aortic valve sclerosis/calcification is present, without any evidence of aortic stenosis. Aortic valve mean gradient measures 2.0 mmHg. Aortic valve peak gradient measures 4.2 mmHg. Aortic valve area, by VTI measures 4.14 cm. Pulmonic Valve: The pulmonic valve was normal in structure. Pulmonic valve regurgitation is not visualized. No evidence of pulmonic stenosis. Aorta: The aortic root is normal in size and structure. Venous: The inferior vena cava is normal in size with greater than 50% respiratory variability, suggesting right atrial  pressure of 3 mmHg. IAS/Shunts: The interatrial septum was not well visualized.  LEFT VENTRICLE PLAX 2D LVIDd:         3.70 cm   Diastology LVIDs:         2.50 cm   LV e' medial:    5.33 cm/s LV PW:         0.60 cm   LV E/e' medial:  13.1 LV IVS:        0.90 cm   LV e' lateral:   4.68 cm/s LVOT diam:     2.10 cm   LV E/e' lateral: 14.9 LV SV:         85 LV SV Index:   44 LVOT Area:     3.46 cm  RIGHT VENTRICLE TAPSE (M-mode): 2.2 cm LEFT ATRIUM             Index        RIGHT ATRIUM           Index LA diam:        3.10 cm 1.61 cm/m   RA Area:     13.90 cm LA Vol (A2C):   66.7 ml 34.59 ml/m  RA Volume:   32.10  ml  16.65 ml/m LA Vol (A4C):   36.5 ml 18.93 ml/m LA Biplane Vol: 50.7 ml 26.30 ml/m  AORTIC VALVE AV Area (Vmax):    3.63 cm AV Area (Vmean):   3.53 cm AV Area (VTI):     4.14 cm AV Vmax:           103.00 cm/s AV Vmean:          75.000 cm/s AV VTI:            0.206 m AV Peak Grad:      4.2 mmHg AV Mean Grad:      2.0 mmHg LVOT Vmax:         108.00 cm/s LVOT Vmean:        76.400 cm/s LVOT VTI:          0.246 m LVOT/AV VTI ratio: 1.19  AORTA Ao Root diam: 2.90 cm MITRAL VALVE MV Area (PHT): 2.38 cm    SHUNTS MV Decel Time: 319 msec    Systemic VTI:  0.25 m MR Peak grad: 4.3 mmHg     Systemic Diam: 2.10 cm MR Vmax:      104.00 cm/s MV E velocity: 69.80 cm/s MV A velocity: 64.30 cm/s MV E/A ratio:  1.09 Jenkins Rouge MD Electronically signed by Jenkins Rouge MD Signature Date/Time: 09/07/2021/2:14:05 PM    Final    US THYROID  Result Date: 09/07/2021 CLINICAL DATA:  Incidental on CT. EXAM: THYROID ULTRASOUND TECHNIQUE: Ultrasound examination of the thyroid gland and adjacent soft tissues was performed. COMPARISON:  CT scan 09/05/2021 FINDINGS: Parenchymal Echotexture: Normal Isthmus: 0.4 cm Right lobe: 5.0 x 2.1 x 2.4 cm Left lobe: 4.5 x 2.4 x 2.1 cm _________________________________________________________ Estimated total number of nodules >/= 1 cm: 2 Number of spongiform nodules >/=  2 cm not described below (TR1): 0 Number of mixed cystic and solid nodules >/= 1.5 cm not described below (TR2): 0 _________________________________________________________ Nodule # 1: Small 1 cm isoechoic solid nodule in the right upper gland does not meet criteria to warrant further evaluation. Nodule # 2: Location: Right; Mid Maximum size: 2.0 cm; Other 2 dimensions: 1.8 x 1.7 cm Composition: solid/almost completely solid (2) Echogenicity: isoechoic (1) Shape: not taller-than-wide (0) Margins: ill-defined (0) Echogenic foci: none (  0) ACR TI-RADS total points: 3. ACR TI-RADS risk category: TR3 (3 points). ACR TI-RADS  recommendations: *Given size (>/= 1.5 - 2.4 cm) and appearance, a follow-up ultrasound in 1 year should be considered based on TI-RADS criteria. _________________________________________________________ IMPRESSION: 1. Extremely subtle 2 cm TI-RADS category 3 nodule in the right mid gland corresponds with the finding on prior CT imaging. This nodule meets criteria for imaging surveillance. Recommend follow-up ultrasound in 1 year. The above is in keeping with the ACR TI-RADS recommendations - J Am Coll Radiol 2017;14:587-595. Electronically Signed   By: Jacqulynn Cadet M.D.   On: 09/07/2021 07:20   CT HEAD CODE STROKE WO CONTRAST  Result Date: 09/05/2021 CLINICAL DATA:  Code stroke. EXAM: CT HEAD WITHOUT CONTRAST TECHNIQUE: Contiguous axial images were obtained from the base of the skull through the vertex without intravenous contrast. COMPARISON:  Prior study from 09/25/2017. FINDINGS: Brain: Age-related cerebral atrophy with chronic small vessel ischemic disease. No acute intracranial hemorrhage. No acute large vessel territory infarct. No mass lesion, midline shift or mass effect. No hydrocephalus or extra-axial fluid collection. Vascular: No hyperdense vessel. Calcified atherosclerosis present at skull base. Skull: Scalp soft tissues within normal limits.  Calvarium intact. Sinuses/Orbits: Globes orbital soft tissues within normal limits. Scattered mucosal thickening noted within the ethmoidal air cells. Paranasal sinuses and mastoid air cells are otherwise clear. Other: None. ASPECTS [facility] Stroke Program Early CT Score) - Ganglionic level infarction (caudate, lentiform nuclei, internal capsule, insula, M1-M3 cortex): 7 - Supraganglionic infarction (M4-M6 cortex): 3 Total score (0-10 with 10 being normal): 10 IMPRESSION: 1. No acute intracranial infarct or other abnormality. 2. ASPECTS is 10. 3. Age-related cerebral atrophy with chronic small vessel ischemic disease. Critical Value/emergent results were  called by telephone at the time of interpretation on 09/05/2021 at 8:36 pm to provider [facility] , who verbally acknowledged these results. Electronically Signed   By: Jeannine Boga M.D.   On: 09/05/2021 20:39   CT ANGIO HEAD NECK W WO CM W PERF (CODE STROKE)  Result Date: 09/05/2021 CLINICAL DATA:  / EXAM: CT ANGIOGRAPHY HEAD AND NECK TECHNIQUE: Multidetector CT imaging of the head and neck was performed using the standard protocol during bolus administration of intravenous contrast. Multiplanar CT image reconstructions and MIPs were obtained to evaluate the vascular anatomy. Carotid stenosis measurements (when applicable) are obtained utilizing NASCET criteria, using the distal internal carotid diameter as the denominator. CONTRAST:  63mL OMNIPAQUE IOHEXOL 350 MG/ML SOLN, 46mL OMNIPAQUE IOHEXOL 350 MG/ML SOLN COMPARISON:  Prior head CT from earlier the same day. FINDINGS: CTA NECK FINDINGS Aortic arch: Visualized aortic arch normal caliber with normal branch pattern. Mild atheromatous change about the arch itself. No stenosis about the origin the great vessels. Right carotid system: Right CCA patent to the bifurcation without stenosis. Scattered mixed plaque about the right carotid bulb/proximal right ICA without hemodynamically significant stenosis. Right ICA patent distally without stenosis, dissection or occlusion. Left carotid system: Left CCA patent without stenosis. Mild plaque about the origin of the cervical left ICA without significant stenosis. Left ICA patent distally without stenosis, dissection or occlusion. Vertebral arteries: Both vertebral arteries arise from subclavian arteries. No proximal subclavian artery stenosis. Dominant left vertebral artery with diffusely hypoplastic right vertebral artery. Vertebral arteries patent without stenosis, dissection or occlusion. Skeleton: No discrete or worrisome osseous lesions. Mild to moderate multilevel cervical spondylosis, most  pronounced at C5-6 and C6-7. Mild chronic wedging deformity noted at the superior endplates of T2 and T3. No visible acute  osseous finding. Other neck: No other acute soft tissue abnormality within the neck. 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 1.6 cm right thyroid nodule (series 4, image 48), indeterminate. No other mass or adenopathy. Upper chest: Visualized upper chest demonstrates no acute finding. Review of the MIP images confirms the above findings CTA HEAD FINDINGS Anterior circulation: Petrous segments patent bilaterally. Scattered atheromatous change within the carotid siphons with no more than mild multifocal narrowing. 2 mm outpouching arising from the supraclinoid right ICA favored to reflect a vascular infundibulum, although small aneurysm difficult to exclude (series 8, image 104). A similar 2 mm outpouching seen at the contralateral supraclinoid left ICA (series 8, image 128), also favored to reflect a small vascular infundibulum, although aneurysm again difficult to exclude. A1 segments patent bilaterally. Normal anterior communicating artery complex. Anterior cerebral arteries patent without stenosis. No M1 stenosis or occlusion. Normal MCA bifurcations. Distal MCA branches perfused and symmetric. Posterior circulation: Dominant left vertebral artery widely patent to the vertebrobasilar junction. Hypoplastic right vertebral artery patent as well. Neither PICA origin well visualized. Basilar patent to its distal aspect without stenosis. Superior cerebellar arteries patent bilaterally. Both PCAs primarily supplied via the basilar. PCAs patent to their distal aspects without flow-limiting stenosis. Venous sinuses: Patent allowing for timing the contrast bolus. Anatomic variants: Strongly dominant left vertebral artery with diffusely hypoplastic right vertebral artery. No aneurysm. Review of the MIP images confirms the above findings IMPRESSION: 1. Negative CTA for large vessel  occlusion. 2. Mild atherosclerotic change about the carotid bifurcations and carotid siphons without hemodynamically significant stenosis. 3. 2 mm outpouchings arising from the supraclinoid ICAs bilaterally, favored to reflect vascular infundibula, although small aneurysms difficult to exclude. Attention at follow-up recommended. 4. 1.6 cm right thyroid nodule, indeterminate. Further evaluation with dedicated thyroid ultrasound recommended. This could be performed on a nonemergent outpatient basis. (ref: J Am Coll Radiol. 2015 Feb;12(2): 143-50). Electronically Signed   By: Jeannine Boga M.D.   On: 09/05/2021 21:59    DISCHARGE EXAMINATION: Vitals:   09/07/21 0747 09/07/21 0748 09/07/21 1230 09/07/21 1608  BP: (!) 148/75 (!) 148/75 (!) 178/81 134/83  Pulse: 72 69 73 82  Resp:  18 20 16   Temp: 97.9 F (36.6 C)   98.2 F (36.8 C)  TempSrc: Oral Oral  Oral  SpO2: 98% 96% 100% 98%  Weight:      Height:       General appearance: Awake alert.  In no distress Resp: Clear to auscultation bilaterally.  Normal effort Cardio: S1-S2 is normal regular.  No S3-S4.  No rubs murmurs or bruit GI: Abdomen is soft.  Nontender nondistended.  Bowel sounds are present normal.  No masses organomegaly    DISPOSITION: Home  Discharge Instructions     Ambulatory referral to Neurology   Complete by: As directed    Follow up with Dr. Billey Gosling at [facility] in 4 weeks for possible complicated migraine. Thanks.   Call MD for:  difficulty breathing, headache or visual disturbances   Complete by: As directed    Call MD for:  extreme fatigue   Complete by: As directed    Call MD for:  persistant dizziness or light-headedness   Complete by: As directed    Call MD for:  persistant nausea and vomiting   Complete by: As directed    Call MD for:  severe uncontrolled pain   Complete by: As directed    Call MD for:  temperature >100.4   Complete by:  As directed    Diet - low sodium heart healthy   Complete by: As  directed    Discharge instructions   Complete by: As directed    Please take your medications as prescribed.  Follow-up with your primary care provider in 1 week.  You were cared for by a hospitalist during your hospital stay. If you have any questions about your discharge medications or the care you received while you were in the hospital after you are discharged, you can call the unit and asked to speak with the hospitalist on call if the hospitalist that took care of you is not available. Once you are discharged, your primary care physician will handle any further medical issues. Please note that NO REFILLS for any discharge medications will be authorized once you are discharged, as it is imperative that you return to your primary care physician (or establish a relationship with a primary care physician if you do not have one) for your aftercare needs so that they can reassess your need for medications and monitor your lab values. If you do not have a primary care physician, you can call [phone] for a physician referral.   Increase activity slowly   Complete by: As directed          Allergies as of 09/07/2021       Reactions   Sulfa Antibiotics Other (See Comments)   Unknown childhood reaction   Penicillins Rash   Has patient had a PCN reaction causing immediate rash, facial/tongue/throat swelling, SOB or lightheadedness with hypotension: No Has patient had a PCN reaction causing severe rash involving mucus membranes or skin necrosis: No Has patient had a PCN reaction that required hospitalization: No Has patient had a PCN reaction occurring within the last 10 years: No If all of the above answers are "NO", then may proceed with Cephalosporin use.        Medication List     TAKE these medications    allopurinol 100 MG tablet Commonly known as: ZYLOPRIM Take 100 mg by mouth daily.   aspirin 81 MG EC tablet Take 1 tablet (81 mg total) by mouth daily. Swallow whole.    atorvastatin 40 MG tablet Commonly known as: LIPITOR Take 1 tablet (40 mg total) by mouth daily.   clopidogrel 75 MG tablet Commonly known as: PLAVIX Take 1 tablet (75 mg total) by mouth daily for 21 days. Only for 3 weeks   HYDROcodone-acetaminophen 5-325 MG tablet Commonly known as: Norco Take 1-2 tablets by mouth every 6 (six) hours as needed for moderate pain or severe pain.   losartan 25 MG tablet Commonly known as: COZAAR Take 25 mg by mouth daily.   ondansetron 4 MG tablet Commonly known as: Zofran Take 1 tablet (4 mg total) by mouth every 6 (six) hours as needed for nausea or vomiting.   pantoprazole 20 MG tablet Commonly known as: PROTONIX Take 20 mg by mouth daily.   triamcinolone cream 0.1 % Commonly known as: KENALOG Apply 1 application topically daily as needed (for psoriasis rash).          Follow-up Information     Genia Harold, MD Follow up in 1 month(s).   Specialty: Neurology Contact information: [address], [place] [address] [phone]         Vernie Shanks, MD. Schedule an appointment as soon as possible for a visit in 1 week(s).   Specialty: Family Medicine Contact information: [address]  [place]                 TOTAL DISCHARGE TIME: 35 minutes  [facility] Pager on [email or web address]  09/08/2021, 10:27 AM

## 2021-09-07 NOTE — Consult Note (Signed)
Neurology Consultation Reason for Consult: TIA Referring Physician: Ninetta Lights  CC: Speech difficulty  History is obtained from: Patient  HPI: Bernard Young is a 80 y.o. male who has had two episodes of transient speech difficulty on 11/8.  He states that the first episode consisted of word finding difficulty and slurred speech and lasted for approximately 15 minutes.  This subsequently improved, but he sought care in the emergency department where his symptoms returned and therefore code stroke was activated.  He estimates that his recurrent symptoms lasted for approximately 20 minutes, but were gone by the time of his evaluation by teleneurology.   ROS: A 14 point ROS was performed and is negative except as noted in the HPI.  Past Medical History:  Diagnosis Date   Arthritis    Diverticulosis of colon 05/2017   noted on CT abd/pelvis   Enlarged prostate 06/26/2017   mild , noted on CT abd/pelvis   Headache    Hypertension    Inguinal hernia 05/2017   small to moderate right containing bowel loop of sigmoid colon, noted on CT abd/pelvis   Numbness and tingling in left hand    PONV (postoperative nausea and vomiting)    very very bad nausea but no vomiting : "i get so sick of it and it is just awful ; even with a full stomach i cant vomit "    Renal mass, left    Shoulder pain    right    TIA (transient ischemic attack)    nearly 20 years ago      History reviewed. No pertinent family history.   Social History:  reports that he has never smoked. He has never used smokeless tobacco. He reports that he does not drink alcohol and does not use drugs.   Exam: Current vital signs: BP (!) 175/87 (BP Location: Left Arm)   Pulse 67   Temp 97.8 F (36.6 C) (Oral)   Resp 18   Ht 5\' 9"  (1.753 m)   Wt 77.1 kg   SpO2 97%   BMI 25.10 kg/m  Vital signs in last 24 hours: Temp:  [97.8 F (36.6 C)-98.3 F (36.8 C)] 97.8 F (36.6 C) (12/09 1949) Pulse Rate:  [60-73] 67 (12/09  1949) Resp:  [12-18] 18 (12/09 1949) BP: (144-185)/(70-93) 175/87 (12/09 1949) SpO2:  [93 %-100 %] 97 % (12/09 1949)   Physical Exam  Constitutional: Appears well-developed and well-nourished.  Psych: Affect appropriate to situation Eyes: No scleral injection HENT: No OP obstruction MSK: no joint deformities.  Cardiovascular: Normal rate and regular rhythm.  Respiratory: Effort normal, non-labored breathing GI: Soft.  No distension. There is no tenderness.  Skin: WDI  Neuro: Mental Status: Patient is awake, alert, oriented to person, place, month, year, and situation. Patient is able to give a clear and coherent history. No signs of aphasia or neglect Cranial Nerves: II: Visual Fields are full. Pupils are equal, round, and reactive to light.   III,IV, VI: EOMI without ptosis or diploplia.  V: Facial sensation is symmetric to temperature VII: Facial movement is symmetric.  VIII: hearing is intact to voice X: Uvula elevates symmetrically XI: Shoulder shrug is symmetric. XII: tongue is midline without atrophy or fasciculations.  Motor: Tone is normal. Bulk is normal. 5/5 strength was present in all four extremities.  Sensory: Sensation is symmetric to light touch and temperature in the arms and legs. Cerebellar: FNF intact bilaterally    I have reviewed labs in epic and the results  pertinent to this consultation are: Creatinine 1.02  I have reviewed the images obtained: CT/CTA-no acute findings or significant stenosis  Impression: 80 year old male with two episodes of speech difficulty concerning for possible TIA.  I would favor treating as such at the current time.  Recommendations: - HgbA1c, fasting lipid panel - MRI of the brain without contrast - Frequent neuro checks - Echocardiogram - Prophylactic therapy-Antiplatelet med: Aspirin - dose 81mg  and plavix 75mg  daily  after 300mg  load  - Risk factor modification - Telemetry monitoring - PT consult, OT consult,  Speech consult - Stroke team to follow  Roland Rack, MD [facility] [phone]  If 7pm- 7am, please page neurology on call as listed in [place].

## 2021-09-07 NOTE — Progress Notes (Signed)
Discharge instructions discussed with patient. All questions answered. Iv's removed, telemetry discontinued. Clothing returned to patient. Pt wheeled off unit for transport home by wife.  Gwendolyn Grant, RN

## 2021-09-07 NOTE — Progress Notes (Addendum)
STROKE TEAM PROGRESS NOTE   ATTENDING NOTE: I reviewed above note and agree with the assessment and plan. Pt was seen and examined.   80 year old male with history of hypertension, TIA versus migraine admitted for episode of speech difficulty x2, lasting 50 minutes and 20 minutes.  On further questioning, patient had 3 episode 25 years ago with either speech difficulty, or left face arm numbness.  Admitted at the time and considered a TIA.  However, patient also reported migraine visual aura with squiggly line without headache average once every 2 months.  CT, CTA head and neck, MRI as well as 2D echo unremarkable.  A1c 5.6, LDL 110, creatinine 1.02.  On exam, patient neurologically intact.  Etiology for patient symptoms concerning for TIA versus migraine aura.  Recommend 30-day cardiac event monitoring as outpatient to rule out A. fib.  Continue aspirin 81 and Plavix 75 DAPT for 3 weeks and then aspirin alone.  Continue Lipitor 40.  PT/OT no recommendation.   For detailed assessment and plan, please refer to above as I have made changes wherever appropriate.   Neurology will sign off. Please call with questions. Pt will follow up with Dr. Billey Gosling at [facility] in about 4 weeks. Thanks for the consult.   Rosalin Hawking, MD PhD Stroke Neurology 09/07/2021 5:26 PM   INTERVAL HISTORY Patient is seen in his room with his wife at the bedside.  Prior to admission, he had a transient episode of aphasia which lasted about 15 minutes.  While in the ED, these symptoms reoccurred for about 20 minutes.  He has a history of two similar transient episodes of aphasia that occurred 25 years ago, as well and a transient episode of left arm numbness during the same time period.  Patient states that he has "silent migraines" which consist of visual disturbances (squiggly lines in field of vision).  Neurological exam is currently WNL and patient has been hemodynamically stable.  Will treat this episode of aphasia as a TIA and  also arrange follow-up with a headache specialist.  Vitals:   09/06/21 1949 09/07/21 0338 09/07/21 0747 09/07/21 0748  BP: (!) 175/87 (!) 158/97 (!) 148/75 (!) 148/75  Pulse: 67 64 72 69  Resp: 18 18  18   Temp: 97.8 F (36.6 C) 97.8 F (36.6 C) 97.9 F (36.6 C)   TempSrc: Oral Oral Oral Oral  SpO2: 97% 95% 98% 96%  Weight:      Height:       CBC:  Recent Labs  Lab 09/05/21 1956  WBC 6.9  NEUTROABS 3.6  HGB 13.3  HCT 40.7  MCV 90.6  PLT 570   Basic Metabolic Panel:  Recent Labs  Lab 09/05/21 1956  NA 141  K 4.3  CL 102  CO2 31  GLUCOSE 103*  BUN 20  CREATININE 1.02  CALCIUM 9.7   Lipid Panel:  Recent Labs  Lab 09/07/21 0152  CHOL 174  TRIG 74  HDL 49  CHOLHDL 3.6  VLDL 15  LDLCALC 110*   HgbA1c:  Recent Labs  Lab 09/07/21 0152  HGBA1C 5.6   Urine Drug Screen:  Recent Labs  Lab 09/05/21 2158  LABOPIA NONE DETECTED  COCAINSCRNUR NONE DETECTED  LABBENZ NONE DETECTED  AMPHETMU NONE DETECTED  THCU NONE DETECTED  LABBARB NONE DETECTED    Alcohol Level  Recent Labs  Lab 09/05/21 1956  ETH <10    IMAGING past 24 hours MR BRAIN WO CONTRAST  Result Date: 09/07/2021 CLINICAL DATA:  Initial evaluation  for acute aphasia, slurred speech. TIA versus stroke. EXAM: MRI HEAD WITHOUT CONTRAST TECHNIQUE: Multiplanar, multiecho pulse sequences of the brain and surrounding structures were obtained without intravenous contrast. COMPARISON:  Prior CTs from 09/05/2021. FINDINGS: Brain: Cerebral volume within normal limits for age. Scattered patchy T2/FLAIR hyperintensity involving the periventricular and deep white matter both cerebral hemispheres most consistent with chronic small vessel ischemic disease, mild in nature. No abnormal foci of restricted diffusion to suggest acute or subacute ischemia. Gray-white matter differentiation maintained. No encephalomalacia to suggest chronic cortical infarction. No evidence for acute or chronic intracranial hemorrhage. No  mass lesion, midline shift or mass effect. No hydrocephalus or extra-axial fluid collection. Pituitary gland suprasellar region within normal limits. Midline structures intact. Vascular: Major intracranial vascular flow voids are maintained. Skull and upper cervical spine: Craniocervical junction within normal limits. Bone marrow signal intensity normal. No scalp soft tissue abnormality. Sinuses/Orbits: Patient status post bilateral ocular lens replacement. Globes orbital soft tissues demonstrate no acute finding. Scattered mucosal thickening noted within the ethmoidal air cells and maxillary sinuses. No mastoid effusion. Other: Asymmetric atrophy involving the partially visualized right parotid gland noted. IMPRESSION: 1. No acute intracranial infarct or other abnormality. 2. Mild chronic microvascular ischemic disease for age. Electronically Signed   By: Jeannine Boga M.D.   On: 09/07/2021 00:00   US THYROID  Result Date: 09/07/2021 CLINICAL DATA:  Incidental on CT. EXAM: THYROID ULTRASOUND TECHNIQUE: Ultrasound examination of the thyroid gland and adjacent soft tissues was performed. COMPARISON:  CT scan 09/05/2021 FINDINGS: Parenchymal Echotexture: Normal Isthmus: 0.4 cm Right lobe: 5.0 x 2.1 x 2.4 cm Left lobe: 4.5 x 2.4 x 2.1 cm _________________________________________________________ Estimated total number of nodules >/= 1 cm: 2 Number of spongiform nodules >/=  2 cm not described below (TR1): 0 Number of mixed cystic and solid nodules >/= 1.5 cm not described below (TR2): 0 _________________________________________________________ Nodule # 1: Small 1 cm isoechoic solid nodule in the right upper gland does not meet criteria to warrant further evaluation. Nodule # 2: Location: Right; Mid Maximum size: 2.0 cm; Other 2 dimensions: 1.8 x 1.7 cm Composition: solid/almost completely solid (2) Echogenicity: isoechoic (1) Shape: not taller-than-wide (0) Margins: ill-defined (0) Echogenic foci: none (0)  ACR TI-RADS total points: 3. ACR TI-RADS risk category: TR3 (3 points). ACR TI-RADS recommendations: *Given size (>/= 1.5 - 2.4 cm) and appearance, a follow-up ultrasound in 1 year should be considered based on TI-RADS criteria. _________________________________________________________ IMPRESSION: 1. Extremely subtle 2 cm TI-RADS category 3 nodule in the right mid gland corresponds with the finding on prior CT imaging. This nodule meets criteria for imaging surveillance. Recommend follow-up ultrasound in 1 year. The above is in keeping with the ACR TI-RADS recommendations - J Am Coll Radiol 2017;14:587-595. Electronically Signed   By: Jacqulynn Cadet M.D.   On: 09/07/2021 07:20    PHYSICAL EXAM General:  Patient is an alert, well-developed male in no acute distress.   NEURO:  Mental Status: AA&Ox3  Speech/Language: speech is without dysarthria or aphasia.  Naming, repetition, fluency, and comprehension intact.  Cranial Nerves:  II: PERRL. Visual fields full.  III, IV, VI: EOMI. Eyelids elevate symmetrically.  V: Sensation is intact to light touch and symmetrical to face.  VII: Smile is symmetrical. VIII: hearing intact to voice. IX, X:  Phonation is normal.  XII: tongue is midline without fasciculations. Motor: 5/5 strength to all muscle groups tested.  Tone: is normal and bulk is normal Sensation- Intact to light touch bilaterally. Extinction  absent to light touch to DSS.  Coordination: FTN intact bilaterally. No drift.  Gait- deferred   ASSESSMENT/PLAN Bernard Young is a 80 y.o. male with history of HTN, TIA, gout and renal neoplasm s/p left nephrectomy presenting with transient episodes of aphasia. On 12/8, he had a transient episode of aphasia which lasted about 15 minutes, causing him to present to the ED.  While in the ED, these symptoms reoccurred for about 20 minutes.  He has a history of two similar transient episodes of aphasia that occurred 25 years ago, as well and a  transient episode of left arm numbness during the same time period.  Patient states that he has "silent migraines" which consist of visual disturbances (squiggly lines in field of vision).  Neurological exam is currently WNL and patient has been hemodynamically stable.  Will treat this episode of aphasia as a TIA and also arrange follow-up with a headache specialist.  TIA vs. migraine equivalent Code Stroke CT head No acute abnormality. Age-related atrophy with chronic small vessel disease. ASPECTS 10.    CTA head & neck Negative for LVO, mild atherosclerotic changes in bilateral carotid arteries, 65mm outpouchings from supraclinoid carotids, likely infundibula, 1.6 cm right thyroid nodule MRI  No acute abnormality, mild chronic microvascular ischemic disease 2D Echo EF 60-65%, atrial septum not well visualized LDL 110 HgbA1c 5.6 VTE prophylaxis - lovenox No antithrombotic prior to admission, now on aspirin 81 mg daily and clopidogrel 75 mg daily. Continue DAPAT for 3 weeks and aspirin alone indefinitely. Arrange follow up with headache specialist as outpatient to discuss possibility of complex migraine Therapy recommendations: none Disposition:  home today  Hypertension Home meds:  losartan 25 mg daily Stable Long-term BP goal normotensive  Hyperlipidemia Home meds:  none LDL 110, goal < 70 atorvastatin 40 mg daily  Continue statin at discharge   Other Stroke Risk Factors Advanced Age >/= 31  Hx TIA vs. Migraine aura Migraines  Other Active Problems 1.6 cm right thyroid nodule Outpatient thyroid ultrasound recommended  Hospital day # [place] , MSN, AGACNP-BC [facility] See Amion for schedule and pager information 09/07/2021 12:01 PM    To contact Stroke Continuity provider, please refer to [email or web address] After hours, contact General Neurology

## 2021-09-07 NOTE — Progress Notes (Addendum)
[facility] PROGRESS NOTE   Bernard Young ZOX:[id number] DOB: [birth year] DOA: 09/05/2021  PCP: Vernie Shanks, MD  Brief History/Interval Summary: 80 y.o. male with medical history significant of hypertension, TIA, gout, GERD, renal neoplasm status post left nephrectomy presented to the ED on 12/8 with waxing and waning aphasia and dysarthria beginning around 1900 in the evening.  On arrival to the ED, his speech was clear but later he started having aphasia again and code stroke was activated. Tele-neurology was consulted.  NIH was 0 at the time of evaluation by neurology.  CT head negative for acute stroke.  CTA head and neck/perfusion study negative for LVO.  Showing 2 mm outpouchings arising from the supraclinoid ICAs bilaterally, favored to reflect vascular infundibula, although small aneurysms difficult to exclude.  Patient was hospitalized for TIA work-up.     Reason for Visit: TIA  Consultants: Neurology  Procedures: Transthoracic echocardiogram    Subjective/Interval History: Patient mentions that his speech is back to baseline.  Denies any weakness on any one side of his body.  No nausea vomiting.  No shortness of breath    Assessment/Plan:  TIA MRI did not show any stroke.  Symptoms have resolved. LDL 110.  HbA1c 5.6.  TSH 0.39.  Urine drug screen negative.  EKG did not show any atrial fibrillation PT and OT and speech evaluation. Echocardiogram is pending. Patient is currently on aspirin and Plavix. Started on atorvastatin. CT angiogram also showed 2 mm outpouching from the supraclinoid ICAs bilaterally favored to reflect vascular infundibulum although small aneurysms cannot be excluded.  Recommend outpatient follow-up with neurology for same.  Thyroid nodule Right thyroid nodule was noted incidentally on CT scan.  Ultrasound was done.  Recommendation is to repeat imaging study in 1 year.  TSH is normal.  Essential hypertension Permissive hypertension being  allowed.  Noted to be on losartan prior to admission.  History of gout On allopurinol   DVT Prophylaxis: Lovenox Code Status: Full code Family Communication: Discussed with the patient Disposition Plan: Hopefully return home once work-up has been completed and cleared by neurology.  Status is: Observation  The patient remains OBS appropriate and will d/c before 2 midnights.      Medications: Scheduled:  aspirin EC  81 mg Oral Daily   atorvastatin  40 mg Oral Daily   [START ON 09/08/2021] clopidogrel  75 mg Oral Daily   enoxaparin (LOVENOX) injection  40 mg Subcutaneous QHS   Continuous: WJX:[id number] **OR** acetaminophen (TYLENOL) oral liquid 160 mg/5 mL **OR** acetaminophen  Antibiotics: Anti-infectives (From admission, onward)    None       Objective:  Vital Signs  Vitals:   09/06/21 1949 09/07/21 0338 09/07/21 0747 09/07/21 0748  BP: (!) 175/87 (!) 158/97 (!) 148/75 (!) 148/75  Pulse: 67 64 72 69  Resp: 18 18  18   Temp: 97.8 F (36.6 C) 97.8 F (36.6 C) 97.9 F (36.6 C)   TempSrc: Oral Oral Oral Oral  SpO2: 97% 95% 98% 96%  Weight:      Height:       No intake or output data in the 24 hours ending 09/07/21 1038 Filed Weights   09/05/21 1950  Weight: 77.1 kg    General appearance: Awake alert.  In no distress Resp: Clear to auscultation bilaterally.  Normal effort Cardio: S1-S2 is normal regular.  No S3-S4.  No rubs murmurs or bruit GI: Abdomen is soft.  Nontender nondistended.  Bowel sounds are present normal.  No masses  organomegaly Extremities: No edema.  Full range of motion of lower extremities. Neurologic: Alert and oriented x3.  No focal neurological deficits.    Lab Results:  Data Reviewed: I have personally reviewed following labs and imaging studies  CBC: Recent Labs  Lab 09/05/21 1956  WBC 6.9  NEUTROABS 3.6  HGB 13.3  HCT 40.7  MCV 90.6  PLT 458    Basic Metabolic Panel: Recent Labs  Lab 09/05/21 1956  NA 141   K 4.3  CL 102  CO2 31  GLUCOSE 103*  BUN 20  CREATININE 1.02  CALCIUM 9.7    GFR: Estimated Creatinine Clearance: 57.8 mL/min (by C-G formula based on SCr of 1.02 mg/dL).  Liver Function Tests: Recent Labs  Lab 09/05/21 1956  AST 17  ALT 14  ALKPHOS 59  BILITOT 0.3  PROT 7.0  ALBUMIN 4.4     Coagulation Profile: Recent Labs  Lab 09/05/21 1956  INR 0.9     HbA1C: Recent Labs    09/07/21 0152  HGBA1C 5.6    CBG: Recent Labs  Lab 09/05/21 2001  GLUCAP 94    Lipid Profile: Recent Labs    09/07/21 0152  CHOL 174  HDL 49  LDLCALC 110*  TRIG 74  CHOLHDL 3.6    Thyroid Function Tests: Recent Labs    09/07/21 0745  TSH 0.399     Recent Results (from the past 240 hour(s))  Resp Panel by RT-PCR (Flu A&B, Covid) Nasopharyngeal Swab     Status: None   Collection Time: 09/05/21  8:06 PM   Specimen: Nasopharyngeal Swab; Nasopharyngeal(NP) swabs in vial transport medium  Result Value Ref Range Status   SARS Coronavirus 2 by RT PCR NEGATIVE NEGATIVE Final    Comment: (NOTE) SARS-CoV-2 target nucleic acids are NOT DETECTED.  The SARS-CoV-2 RNA is generally detectable in upper respiratory specimens during the acute phase of infection. The lowest concentration of SARS-CoV-2 viral copies this assay can detect is 138 copies/mL. A negative result does not preclude SARS-Cov-2 infection and should not be used as the sole basis for treatment or other patient management decisions. A negative result may occur with  improper specimen collection/handling, submission of specimen other than nasopharyngeal swab, presence of viral mutation(s) within the areas targeted by this assay, and inadequate number of viral copies(<138 copies/mL). A negative result must be combined with clinical observations, patient history, and epidemiological information. The expected result is Negative.  Fact Sheet for Patients:  EntrepreneurPulse.com.au  Fact  Sheet for Healthcare Providers:  IncredibleEmployment.be  This test is no t yet approved or cleared by the [place] FDA and  has been authorized for detection and/or diagnosis of SARS-CoV-2 by FDA under an Emergency Use Authorization (EUA). This EUA will remain  in effect (meaning this test can be used) for the duration of the COVID-19 declaration under Section 564(b)(1) of the Act, 21 U.S.C.section 360bbb-3(b)(1), unless the authorization is terminated  or revoked sooner.       Influenza A by PCR NEGATIVE NEGATIVE Final   Influenza B by PCR NEGATIVE NEGATIVE Final    Comment: (NOTE) The Xpert Xpress SARS-CoV-2/FLU/RSV plus assay is intended as an aid in the diagnosis of influenza from Nasopharyngeal swab specimens and should not be used as a sole basis for treatment. Nasal washings and aspirates are unacceptable for Xpert Xpress SARS-CoV-2/FLU/RSV testing.  Fact Sheet for Patients: EntrepreneurPulse.com.au  Fact Sheet for Healthcare Providers: IncredibleEmployment.be  This test is not yet approved or cleared by the [place]  States FDA and has been authorized for detection and/or diagnosis of SARS-CoV-2 by FDA under an Emergency Use Authorization (EUA). This EUA will remain in effect (meaning this test can be used) for the duration of the COVID-19 declaration under Section 564(b)(1) of the Act, 21 U.S.C. section 360bbb-3(b)(1), unless the authorization is terminated or revoked.  Performed at [facility], [address] [place], [place] [address]       Radiology Studies: MR BRAIN WO CONTRAST  Result Date: 09/07/2021 CLINICAL DATA:  Initial evaluation for acute aphasia, slurred speech. TIA versus stroke. EXAM: MRI HEAD WITHOUT CONTRAST TECHNIQUE: Multiplanar, multiecho pulse sequences of the brain and surrounding structures were obtained without intravenous contrast. COMPARISON:  Prior CTs from  09/05/2021. FINDINGS: Brain: Cerebral volume within normal limits for age. Scattered patchy T2/FLAIR hyperintensity involving the periventricular and deep white matter both cerebral hemispheres most consistent with chronic small vessel ischemic disease, mild in nature. No abnormal foci of restricted diffusion to suggest acute or subacute ischemia. Gray-white matter differentiation maintained. No encephalomalacia to suggest chronic cortical infarction. No evidence for acute or chronic intracranial hemorrhage. No mass lesion, midline shift or mass effect. No hydrocephalus or extra-axial fluid collection. Pituitary gland suprasellar region within normal limits. Midline structures intact. Vascular: Major intracranial vascular flow voids are maintained. Skull and upper cervical spine: Craniocervical junction within normal limits. Bone marrow signal intensity normal. No scalp soft tissue abnormality. Sinuses/Orbits: Patient status post bilateral ocular lens replacement. Globes orbital soft tissues demonstrate no acute finding. Scattered mucosal thickening noted within the ethmoidal air cells and maxillary sinuses. No mastoid effusion. Other: Asymmetric atrophy involving the partially visualized right parotid gland noted. IMPRESSION: 1. No acute intracranial infarct or other abnormality. 2. Mild chronic microvascular ischemic disease for age. Electronically Signed   By: Jeannine Boga M.D.   On: 09/07/2021 00:00   US THYROID  Result Date: 09/07/2021 CLINICAL DATA:  Incidental on CT. EXAM: THYROID ULTRASOUND TECHNIQUE: Ultrasound examination of the thyroid gland and adjacent soft tissues was performed. COMPARISON:  CT scan 09/05/2021 FINDINGS: Parenchymal Echotexture: Normal Isthmus: 0.4 cm Right lobe: 5.0 x 2.1 x 2.4 cm Left lobe: 4.5 x 2.4 x 2.1 cm _________________________________________________________ Estimated total number of nodules >/= 1 cm: 2 Number of spongiform nodules >/=  2 cm not described below  (TR1): 0 Number of mixed cystic and solid nodules >/= 1.5 cm not described below (TR2): 0 _________________________________________________________ Nodule # 1: Small 1 cm isoechoic solid nodule in the right upper gland does not meet criteria to warrant further evaluation. Nodule # 2: Location: Right; Mid Maximum size: 2.0 cm; Other 2 dimensions: 1.8 x 1.7 cm Composition: solid/almost completely solid (2) Echogenicity: isoechoic (1) Shape: not taller-than-wide (0) Margins: ill-defined (0) Echogenic foci: none (0) ACR TI-RADS total points: 3. ACR TI-RADS risk category: TR3 (3 points). ACR TI-RADS recommendations: *Given size (>/= 1.5 - 2.4 cm) and appearance, a follow-up ultrasound in 1 year should be considered based on TI-RADS criteria. _________________________________________________________ IMPRESSION: 1. Extremely subtle 2 cm TI-RADS category 3 nodule in the right mid gland corresponds with the finding on prior CT imaging. This nodule meets criteria for imaging surveillance. Recommend follow-up ultrasound in 1 year. The above is in keeping with the ACR TI-RADS recommendations - J Am Coll Radiol 2017;14:587-595. Electronically Signed   By: Jacqulynn Cadet M.D.   On: 09/07/2021 07:20   CT HEAD CODE STROKE WO CONTRAST  Result Date: 09/05/2021 CLINICAL DATA:  Code stroke. EXAM: CT HEAD WITHOUT CONTRAST TECHNIQUE: Contiguous  axial images were obtained from the base of the skull through the vertex without intravenous contrast. COMPARISON:  Prior study from 09/25/2017. FINDINGS: Brain: Age-related cerebral atrophy with chronic small vessel ischemic disease. No acute intracranial hemorrhage. No acute large vessel territory infarct. No mass lesion, midline shift or mass effect. No hydrocephalus or extra-axial fluid collection. Vascular: No hyperdense vessel. Calcified atherosclerosis present at skull base. Skull: Scalp soft tissues within normal limits.  Calvarium intact. Sinuses/Orbits: Globes orbital soft tissues  within normal limits. Scattered mucosal thickening noted within the ethmoidal air cells. Paranasal sinuses and mastoid air cells are otherwise clear. Other: None. ASPECTS [facility] Stroke Program Early CT Score) - Ganglionic level infarction (caudate, lentiform nuclei, internal capsule, insula, M1-M3 cortex): 7 - Supraganglionic infarction (M4-M6 cortex): 3 Total score (0-10 with 10 being normal): 10 IMPRESSION: 1. No acute intracranial infarct or other abnormality. 2. ASPECTS is 10. 3. Age-related cerebral atrophy with chronic small vessel ischemic disease. Critical Value/emergent results were called by telephone at the time of interpretation on 09/05/2021 at 8:36 pm to provider [facility] , who verbally acknowledged these results. Electronically Signed   By: Jeannine Boga M.D.   On: 09/05/2021 20:39   CT ANGIO HEAD NECK W WO CM W PERF (CODE STROKE)  Result Date: 09/05/2021 CLINICAL DATA:  / EXAM: CT ANGIOGRAPHY HEAD AND NECK TECHNIQUE: Multidetector CT imaging of the head and neck was performed using the standard protocol during bolus administration of intravenous contrast. Multiplanar CT image reconstructions and MIPs were obtained to evaluate the vascular anatomy. Carotid stenosis measurements (when applicable) are obtained utilizing NASCET criteria, using the distal internal carotid diameter as the denominator. CONTRAST:  50mL OMNIPAQUE IOHEXOL 350 MG/ML SOLN, 64mL OMNIPAQUE IOHEXOL 350 MG/ML SOLN COMPARISON:  Prior head CT from earlier the same day. FINDINGS: CTA NECK FINDINGS Aortic arch: Visualized aortic arch normal caliber with normal branch pattern. Mild atheromatous change about the arch itself. No stenosis about the origin the great vessels. Right carotid system: Right CCA patent to the bifurcation without stenosis. Scattered mixed plaque about the right carotid bulb/proximal right ICA without hemodynamically significant stenosis. Right ICA patent distally without stenosis, dissection  or occlusion. Left carotid system: Left CCA patent without stenosis. Mild plaque about the origin of the cervical left ICA without significant stenosis. Left ICA patent distally without stenosis, dissection or occlusion. Vertebral arteries: Both vertebral arteries arise from subclavian arteries. No proximal subclavian artery stenosis. Dominant left vertebral artery with diffusely hypoplastic right vertebral artery. Vertebral arteries patent without stenosis, dissection or occlusion. Skeleton: No discrete or worrisome osseous lesions. Mild to moderate multilevel cervical spondylosis, most pronounced at C5-6 and C6-7. Mild chronic wedging deformity noted at the superior endplates of T2 and T3. No visible acute osseous finding. Other neck: No other acute soft tissue abnormality within the neck. 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 1.6 cm right thyroid nodule (series 4, image 48), indeterminate. No other mass or adenopathy. Upper chest: Visualized upper chest demonstrates no acute finding. Review of the MIP images confirms the above findings CTA HEAD FINDINGS Anterior circulation: Petrous segments patent bilaterally. Scattered atheromatous change within the carotid siphons with no more than mild multifocal narrowing. 2 mm outpouching arising from the supraclinoid right ICA favored to reflect a vascular infundibulum, although small aneurysm difficult to exclude (series 8, image 104). A similar 2 mm outpouching seen at the contralateral supraclinoid left ICA (series 8, image 128), also favored to reflect a small vascular infundibulum, although aneurysm again  difficult to exclude. A1 segments patent bilaterally. Normal anterior communicating artery complex. Anterior cerebral arteries patent without stenosis. No M1 stenosis or occlusion. Normal MCA bifurcations. Distal MCA branches perfused and symmetric. Posterior circulation: Dominant left vertebral artery widely patent to the vertebrobasilar junction.  Hypoplastic right vertebral artery patent as well. Neither PICA origin well visualized. Basilar patent to its distal aspect without stenosis. Superior cerebellar arteries patent bilaterally. Both PCAs primarily supplied via the basilar. PCAs patent to their distal aspects without flow-limiting stenosis. Venous sinuses: Patent allowing for timing the contrast bolus. Anatomic variants: Strongly dominant left vertebral artery with diffusely hypoplastic right vertebral artery. No aneurysm. Review of the MIP images confirms the above findings IMPRESSION: 1. Negative CTA for large vessel occlusion. 2. Mild atherosclerotic change about the carotid bifurcations and carotid siphons without hemodynamically significant stenosis. 3. 2 mm outpouchings arising from the supraclinoid ICAs bilaterally, favored to reflect vascular infundibula, although small aneurysms difficult to exclude. Attention at follow-up recommended. 4. 1.6 cm right thyroid nodule, indeterminate. Further evaluation with dedicated thyroid ultrasound recommended. This could be performed on a nonemergent outpatient basis. (ref: J Am Coll Radiol. 2015 Feb;12(2): 143-50). Electronically Signed   By: Jeannine Boga M.D.   On: 09/05/2021 21:59       LOS: 0 days   [facility] Pager on [email or web address]  09/07/2021, 10:38 AM

## 2021-09-07 NOTE — Progress Notes (Signed)
SLP Cancellation Note  Patient Details Name: Bernard Young MRN: [id number] DOB: [birth year]   Cancelled treatment:       Reason Eval/Treat Not Completed: SLP screened. Pt and wife report pt at baseline function in regards to speech, language and cognitive functions. No needs identified, will sign off.    [place], [place], [place] [facility] Office Number: [phone]  Acie Fredrickson 09/07/2021, 3:44 PM

## 2021-09-07 NOTE — Care Management (Signed)
  Transition of Care [facility]) Screening Note   Patient Details  Name: Bernard Young Date of Birth: [birth year]   Transition of Care [facility]) CM/SW Contact:    Carles Collet, RN Phone Number: 09/07/2021, 12:38 PM    Transition of Care Department [facility]) has reviewed patient and no TOC needs have been identified at this time. We will continue to monitor patient advancement through interdisciplinary progression rounds. If new patient transition needs arise, please place a TOC consult.

## 2021-09-07 NOTE — Evaluation (Signed)
Occupational Therapy Evaluation Patient Details Name: Bernard Young MRN: [id number] DOB: 01/27/1941 Today's Date: 09/07/2021   History of Present Illness 80 y.o. male admitted with 2 episodes of transient speech difficulty.  MRI negative for acute intracranial process.  CTA negative for LVO.  PMH includes: numbness and tingling of Lt hand, shoulder pain Rt.  TIA, OA,  Lt  joint replacement   Clinical Impression   Pt admitted for concerns listed above. PTA pt reported that he was independent with all ADL's and IADL's, including hiking with his family. At this time, pt appears to be back at his baseline. Vision, cognition, and functional mobility are all within functional limits. He has no further OT needs at this time and acute OT will sign off.       Recommendations for follow up therapy are one component of a multi-disciplinary discharge planning process, led by the attending physician.  Recommendations may be updated based on patient status, additional functional criteria and insurance authorization.   Follow Up Recommendations  No OT follow up    Assistance Recommended at Discharge PRN  Functional Status Assessment  Patient has had a recent decline in their functional status and demonstrates the ability to make significant improvements in function in a reasonable and predictable amount of time.  Equipment Recommendations  None recommended by OT    Recommendations for Other Services       Precautions / Restrictions Precautions Precautions: Fall Restrictions Weight Bearing Restrictions: No      Mobility Bed Mobility Overal bed mobility: Independent                  Transfers Overall transfer level: Modified independent Equipment used: None               General transfer comment: sit to stand from chair and toilet with no difficulties      Balance Overall balance assessment: Mild deficits observed, not formally tested                                          ADL either performed or assessed with clinical judgement   ADL Overall ADL's : At baseline;Modified independent                                       General ADL Comments: No difficulties, balance fair     Vision Baseline Vision/History: 1 Wears glasses Ability to See in Adequate Light: 0 Adequate Patient Visual Report: No change from baseline Vision Assessment?: No apparent visual deficits     Perception     Praxis      Pertinent Vitals/Pain Pain Assessment: No/denies pain     Hand Dominance Right   Extremity/Trunk Assessment Upper Extremity Assessment Upper Extremity Assessment: RUE deficits/detail;LUE deficits/detail RUE Deficits / Details: Limited ROM at 120 degrees flexion RUE Coordination: WNL LUE Deficits / Details: ROM limited at 100 degrees flexion and unable to bring behind back without pain LUE: Shoulder pain with ROM LUE Coordination: decreased gross motor   Lower Extremity Assessment Lower Extremity Assessment: Defer to PT evaluation   Cervical / Trunk Assessment Cervical / Trunk Assessment: Kyphotic   Communication Communication Communication: No difficulties   Cognition Arousal/Alertness: Awake/alert Behavior During Therapy: WFL for tasks assessed/performed Overall Cognitive Status: Within Functional Limits for tasks assessed  General Comments  BP 178/81 after in room ambulation. RN notified reports she will recheck.    Exercises     Shoulder Instructions      Home Living Family/patient expects to be discharged to:: Private residence Living Arrangements: Spouse/significant other Available Help at Discharge: Family;Available 24 hours/day Type of Home: House Home Access: Stairs to enter [employer] of Steps: 5 steps Entrance Stairs-Rails: Can reach both Home Layout: Two level;Able to live on main level with bedroom/bathroom     Bathroom  Shower/Tub: Other (comment) (Bathroom under constructions, will have a walk in handicap tub with grab bars and hand held shower head.)   Bathroom Toilet: Handicapped height Bathroom Accessibility: Yes How Accessible: Accessible via walker Home Equipment:  (Walking stick)          Prior Functioning/Environment Prior Level of Function : Independent/Modified Independent;Driving             Mobility Comments: Uses walking stick in community ADLs Comments: indep        OT Problem List: Decreased range of motion;Impaired balance (sitting and/or standing);Decreased coordination      OT Treatment/Interventions:      OT Goals(Current goals can be found in the care plan section) Acute Rehab OT Goals Patient Stated Goal: To get home to watch the world cup in his recliner. OT Goal Formulation: All assessment and education complete, DC therapy Time For Goal Achievement: 09/07/21 Potential to Achieve Goals: Good  OT Frequency:     Barriers to D/C:            Co-evaluation              AM-PAC OT "6 Clicks" Daily Activity     Outcome Measure Help from another person eating meals?: None Help from another person taking care of personal grooming?: None Help from another person toileting, which includes using toliet, bedpan, or urinal?: None Help from another person bathing (including washing, rinsing, drying)?: None Help from another person to put on and taking off regular upper body clothing?: None Help from another person to put on and taking off regular lower body clothing?: None 6 Click Score: 24   End of Session Nurse Communication: Mobility status;Other (comment) (High BP)  Activity Tolerance: Patient tolerated treatment well Patient left: in bed;with call bell/phone within reach;with family/visitor present  OT Visit Diagnosis: Unsteadiness on feet (R26.81);Muscle weakness (generalized) (M62.81)                Time: 1308-6578 OT Time Calculation (min): 16  min Charges:  OT General Charges $OT Visit: 1 Visit OT Evaluation $OT Eval Low Complexity: 1 Low  Bernard Young H., OTR/L Acute Rehabilitation  Bernard Young Bernard Young 09/07/2021, 1:14 PM

## 2021-09-07 NOTE — Progress Notes (Signed)
2D echocardiogram completed.  09/07/2021 1:46 PM Kelby Aline., MHA, RVT, RDCS, RDMS

## 2021-09-10 DIAGNOSIS — Z5181 Encounter for therapeutic drug level monitoring: Secondary | ICD-10-CM | POA: Diagnosis not present

## 2021-09-10 DIAGNOSIS — F5101 Primary insomnia: Secondary | ICD-10-CM | POA: Diagnosis not present

## 2021-09-10 DIAGNOSIS — Z23 Encounter for immunization: Secondary | ICD-10-CM | POA: Diagnosis not present

## 2021-09-10 DIAGNOSIS — R030 Elevated blood-pressure reading, without diagnosis of hypertension: Secondary | ICD-10-CM | POA: Diagnosis not present

## 2021-09-10 DIAGNOSIS — E7800 Pure hypercholesterolemia, unspecified: Secondary | ICD-10-CM | POA: Diagnosis not present

## 2021-09-10 DIAGNOSIS — K2900 Acute gastritis without bleeding: Secondary | ICD-10-CM | POA: Diagnosis not present

## 2021-09-10 DIAGNOSIS — E041 Nontoxic single thyroid nodule: Secondary | ICD-10-CM | POA: Diagnosis not present

## 2021-09-10 DIAGNOSIS — Z8673 Personal history of transient ischemic attack (TIA), and cerebral infarction without residual deficits: Secondary | ICD-10-CM | POA: Diagnosis not present

## 2021-10-08 DIAGNOSIS — R110 Nausea: Secondary | ICD-10-CM | POA: Diagnosis not present

## 2021-10-08 DIAGNOSIS — E041 Nontoxic single thyroid nodule: Secondary | ICD-10-CM | POA: Diagnosis not present

## 2021-10-08 DIAGNOSIS — M170 Bilateral primary osteoarthritis of knee: Secondary | ICD-10-CM | POA: Diagnosis not present

## 2021-10-08 DIAGNOSIS — Z8673 Personal history of transient ischemic attack (TIA), and cerebral infarction without residual deficits: Secondary | ICD-10-CM | POA: Diagnosis not present

## 2021-10-08 DIAGNOSIS — I10 Essential (primary) hypertension: Secondary | ICD-10-CM | POA: Diagnosis not present

## 2021-10-08 DIAGNOSIS — E7800 Pure hypercholesterolemia, unspecified: Secondary | ICD-10-CM | POA: Diagnosis not present

## 2021-10-08 DIAGNOSIS — F5101 Primary insomnia: Secondary | ICD-10-CM | POA: Diagnosis not present

## 2021-10-08 DIAGNOSIS — Z79899 Other long term (current) drug therapy: Secondary | ICD-10-CM | POA: Diagnosis not present

## 2021-10-08 NOTE — Progress Notes (Signed)
The consultant Teleneurology team requested the CT Perfusion study as the patient was a code stroke to determine if he needed acute intervention so it was ordered.   He was having waxing and waning speech difficulties concerning for acute stroke.   Thanks!

## 2021-10-15 ENCOUNTER — Inpatient Hospital Stay: Payer: PPO | Admitting: Psychiatry

## 2021-10-21 DIAGNOSIS — U071 COVID-19: Secondary | ICD-10-CM | POA: Diagnosis not present

## 2021-11-06 ENCOUNTER — Inpatient Hospital Stay: Payer: PPO | Admitting: Psychiatry

## 2021-11-07 DIAGNOSIS — J0100 Acute maxillary sinusitis, unspecified: Secondary | ICD-10-CM | POA: Diagnosis not present

## 2021-12-13 DIAGNOSIS — R54 Age-related physical debility: Secondary | ICD-10-CM | POA: Diagnosis not present

## 2021-12-13 DIAGNOSIS — F5101 Primary insomnia: Secondary | ICD-10-CM | POA: Diagnosis not present

## 2021-12-13 DIAGNOSIS — E041 Nontoxic single thyroid nodule: Secondary | ICD-10-CM | POA: Diagnosis not present

## 2021-12-13 DIAGNOSIS — M170 Bilateral primary osteoarthritis of knee: Secondary | ICD-10-CM | POA: Diagnosis not present

## 2021-12-13 DIAGNOSIS — E7800 Pure hypercholesterolemia, unspecified: Secondary | ICD-10-CM | POA: Diagnosis not present

## 2021-12-13 DIAGNOSIS — Z79899 Other long term (current) drug therapy: Secondary | ICD-10-CM | POA: Diagnosis not present

## 2021-12-13 DIAGNOSIS — I10 Essential (primary) hypertension: Secondary | ICD-10-CM | POA: Diagnosis not present

## 2021-12-13 DIAGNOSIS — Z8673 Personal history of transient ischemic attack (TIA), and cerebral infarction without residual deficits: Secondary | ICD-10-CM | POA: Diagnosis not present

## 2021-12-18 DIAGNOSIS — Z79899 Other long term (current) drug therapy: Secondary | ICD-10-CM | POA: Diagnosis not present

## 2021-12-18 DIAGNOSIS — E7800 Pure hypercholesterolemia, unspecified: Secondary | ICD-10-CM | POA: Diagnosis not present

## 2021-12-25 DIAGNOSIS — K4090 Unilateral inguinal hernia, without obstruction or gangrene, not specified as recurrent: Secondary | ICD-10-CM | POA: Diagnosis not present

## 2021-12-25 NOTE — Progress Notes (Signed)
 [facility]    REFERRING PHYSICIAN:  Dr. Cyrena  PROVIDER:  DEWARD PURCHASE STECHSCHULTE, MD  MRN: [id number] DOB: 01-10-1941 DATE OF ENCOUNTER: 12/25/2021  Subjective   Chief Complaint: Left inguinal hernia    History of Present Illness: Bernard Young is a 81 y.o. male who is seen today as an office consultation at the request of Dr. Cyrena for evaluation of left inguinal hernia.     Notes from previous visit: He has a bulge in his left groin that he is worried about but does not really cause him any pain discomfort or symptoms.  He spends most of his time reading and is limited by orthopedic injuries as far as activities go so the hernia is really not limiting his activities.  Notes from today: He still really is not bothered by this hernia.  It does not limit his activities.  He feels it is about the same as it was 6 months ago without any major change.  Review of Systems: A complete review of systems was obtained from the patient.  I have reviewed this information and discussed as appropriate with the patient.  See HPI as well for other ROS.  ROS    Medical History: Past Medical History:  Diagnosis Date  . Arthritis   . GERD (gastroesophageal reflux disease)   . History of cancer   . History of stroke     There is no problem list on file for this patient.   History reviewed. No pertinent surgical history.   Allergies  Allergen Reactions  . Sulfa (Sulfonamide Antibiotics) Other (See Comments)    Unknown childhood reaction  . Penicillins Rash    Has patient had a PCN reaction causing immediate rash, facial/tongue/throat swelling, SOB or lightheadedness with hypotension: No Has patient had a PCN reaction causing severe rash involving mucus membranes or skin necrosis: No Has patient had a PCN reaction that required hospitalization: No Has patient had a PCN reaction occurring within the last 10 years: No If all of the above answers are NO, then may proceed with Cephalosporin  use.    Current Outpatient Medications on File Prior to Visit  Medication Sig Dispense Refill  . aspirin  81 MG EC tablet 1 tablet    . atorvastatin  (LIPITOR) 40 MG tablet 1 tablet    . losartan  (COZAAR ) 50 MG tablet 1 tablet    . allopurinoL  (ZYLOPRIM ) 100 MG tablet TAKE 1 TABLET BY MOUTH EVERY DAY FOR 30 DAYS    . famotidine (PEPCID) 40 MG tablet TAKE 1 TABLET BY MOUTH EVERY DAY AT BEDTIME FOR 30 DAYS    . HYDROcodone -acetaminophen  (NORCO) 5-325 mg tablet TAKE 1-2 TABLETS BY MOUTH DAILY AS NEEDED    . ondansetron  (ZOFRAN ) 4 MG tablet Take 4 mg by mouth once daily as needed (Patient not taking: Reported on 12/25/2021)    . triamcinolone  0.1 % cream APPLY 1 APPLICATION TO AFFECTED AREA EXTERNALLY TWICE A DAY FOR 10 DAYS     No current facility-administered medications on file prior to visit.    Family History  Problem Relation Age of Onset  . High blood pressure (Hypertension) Mother   . Breast cancer Mother   . Coronary Artery Disease (Blocked arteries around heart) Father      Social History   Tobacco Use  Smoking Status Never  Smokeless Tobacco Never     Social History   Socioeconomic History  . Marital status: Married  Tobacco Use  . Smoking status: Never  . Smokeless tobacco: Never  Vaping Use  . Vaping Use: Never used  Substance and Sexual Activity  . Alcohol use: Not Currently  . Drug use: Defer  . Sexual activity: Never    Objective:    There were no vitals filed for this visit.  There is no height or weight on file to calculate BMI.  Physical Exam   General appearance - Consistent with stated age. Normal posture. Voice Normal. Mental status - alert and oriented Integumentary - No rash or lesion on limited exam Head - Normocephalic, atraumatic Face - Strength and tone intact Eyes - extraocular movement intact, sclera anicteric Chest - quiet, even and easy respiratory effort with no use of accessory muscles Neurological - able to articulate well with  normal speech/language, rate, volume and coherence. Mood/affect - normal Judgement and insight -  insight is appropriate concerning matters relevant to self and the patient displays appropriate judgment regarding every day activities. Thought Processes/Cognitive Function - aware of current events. Musculoskeletal - strength symmetrical throughout, no deformity Abdomen -palpable inguinal hernia in the left groin with bulging out of the external inguinal ring.  Easily reducible with patient laying flat.  No major changes from previous exam   Labs, Imaging and Diagnostic Testing: None  Assessment and Plan:  Diagnoses and all orders for this visit:  Left inguinal hernia     We discussed his inguinal hernia in detail as well as the options to proceed with surgery versus observation.  We discussed observation of a asymptomatic inguinal hernia and how this is a safe option with a low risk of acute hernia emergency.  At this time he would like to continue to continue to avoid surgery and continue with observation and will see us  back in 6 months.  I gave him instructions if he were to develop symptoms from this hernia to call the office to be seen sooner.  Return in about 6 months (around 06/27/2022).  PAUL JEFFREY STECHSCHULTE, MD

## 2021-12-26 DIAGNOSIS — K219 Gastro-esophageal reflux disease without esophagitis: Secondary | ICD-10-CM | POA: Diagnosis not present

## 2021-12-26 DIAGNOSIS — G8929 Other chronic pain: Secondary | ICD-10-CM | POA: Diagnosis not present

## 2021-12-26 DIAGNOSIS — E7800 Pure hypercholesterolemia, unspecified: Secondary | ICD-10-CM | POA: Diagnosis not present

## 2021-12-26 DIAGNOSIS — I10 Essential (primary) hypertension: Secondary | ICD-10-CM | POA: Diagnosis not present

## 2022-01-13 DIAGNOSIS — D649 Anemia, unspecified: Secondary | ICD-10-CM | POA: Diagnosis not present

## 2022-02-06 DIAGNOSIS — L218 Other seborrheic dermatitis: Secondary | ICD-10-CM | POA: Diagnosis not present

## 2022-02-06 DIAGNOSIS — D0439 Carcinoma in situ of skin of other parts of face: Secondary | ICD-10-CM | POA: Diagnosis not present

## 2022-02-14 DIAGNOSIS — Z85528 Personal history of other malignant neoplasm of kidney: Secondary | ICD-10-CM | POA: Diagnosis not present

## 2022-02-17 DIAGNOSIS — K769 Liver disease, unspecified: Secondary | ICD-10-CM | POA: Diagnosis not present

## 2022-02-17 DIAGNOSIS — K4090 Unilateral inguinal hernia, without obstruction or gangrene, not specified as recurrent: Secondary | ICD-10-CM | POA: Diagnosis not present

## 2022-02-17 DIAGNOSIS — Z85528 Personal history of other malignant neoplasm of kidney: Secondary | ICD-10-CM | POA: Diagnosis not present

## 2022-02-17 DIAGNOSIS — J984 Other disorders of lung: Secondary | ICD-10-CM | POA: Diagnosis not present

## 2022-02-17 DIAGNOSIS — D49511 Neoplasm of unspecified behavior of right kidney: Secondary | ICD-10-CM | POA: Diagnosis not present

## 2022-02-17 DIAGNOSIS — J8410 Pulmonary fibrosis, unspecified: Secondary | ICD-10-CM | POA: Diagnosis not present

## 2022-02-17 DIAGNOSIS — K5730 Diverticulosis of large intestine without perforation or abscess without bleeding: Secondary | ICD-10-CM | POA: Diagnosis not present

## 2022-02-19 DIAGNOSIS — D49511 Neoplasm of unspecified behavior of right kidney: Secondary | ICD-10-CM | POA: Diagnosis not present

## 2022-02-20 DIAGNOSIS — D649 Anemia, unspecified: Secondary | ICD-10-CM | POA: Diagnosis not present

## 2022-03-02 IMAGING — CT CT ANGIO HEAD-NECK (W OR W/O PERF)
3 of 8 series · 9 of 36 positions shown · IV contrast (omnipaque)
Comparison: Prior head CT from earlier the same day.

CLINICAL DATA: /

EXAM:
CT ANGIOGRAPHY HEAD AND NECK
TECHNIQUE: Multidetector CT imaging of the head and neck was performed using
the standard protocol during bolus administration of intravenous
contrast. Multiplanar CT image reconstructions and MIPs were
obtained to evaluate the vascular anatomy. Carotid stenosis
measurements (when applicable) are obtained utilizing NASCET
criteria, using the distal internal carotid diameter as the
denominator.
CONTRAST:  75mL OMNIPAQUE IOHEXOL 350 MG/ML SOLN, 40mL OMNIPAQUE
IOHEXOL 350 MG/ML SOLN

[Series 6: ax thin · axial · 0.42mm/px · z∈[-317,-200]mm · 2 of 374 slices shown]
[im 125/374  soft-tissue]
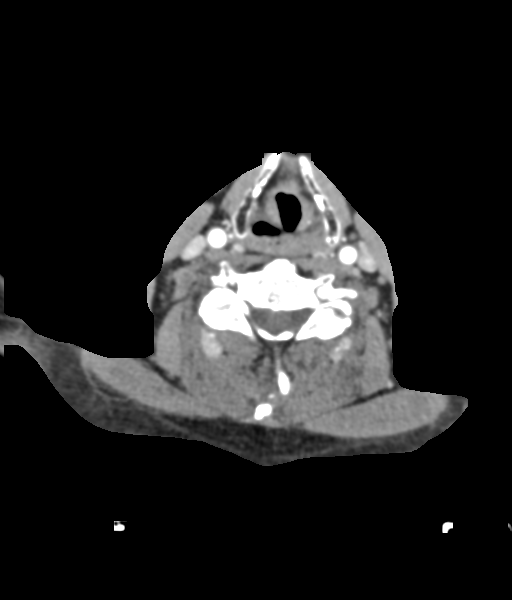
[im 249/374  bone]
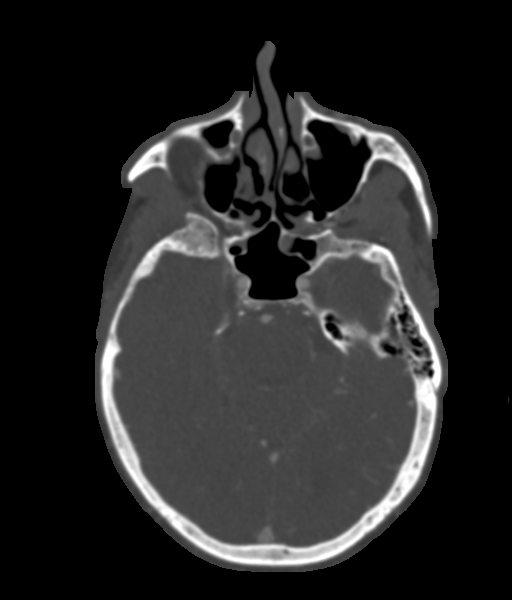

[Series 8: sag thin · sagittal · 0.49mm/px · 2 of 216 slices shown]
[im 15/216  soft-tissue]
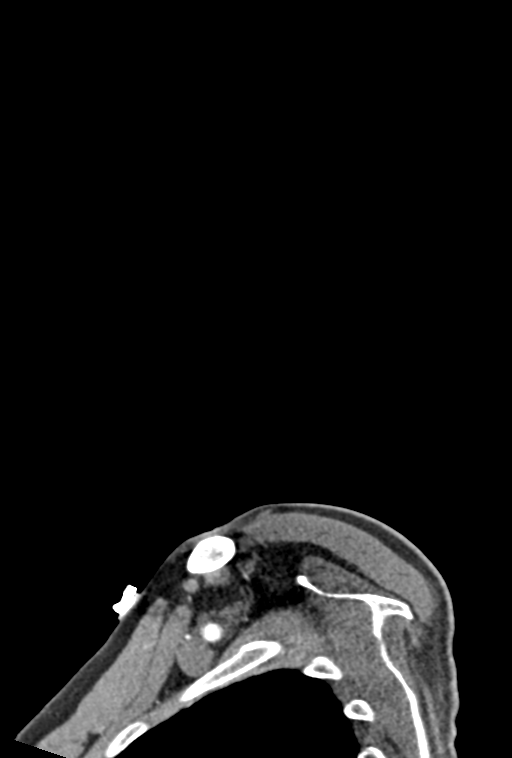
[im 202/216  soft-tissue]
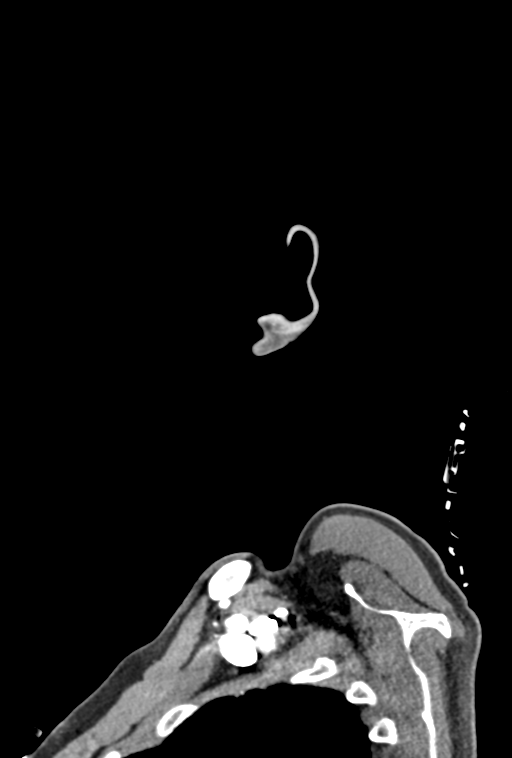

[Series 13: ctp head · axial · 0.39mm/px · z∈[-80,-16]mm · 5 of 690 slices shown]
[im 115/690  soft-tissue]
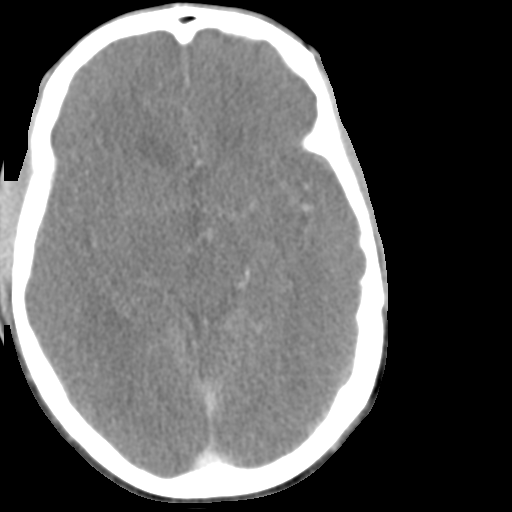
[im 230/690  soft-tissue]
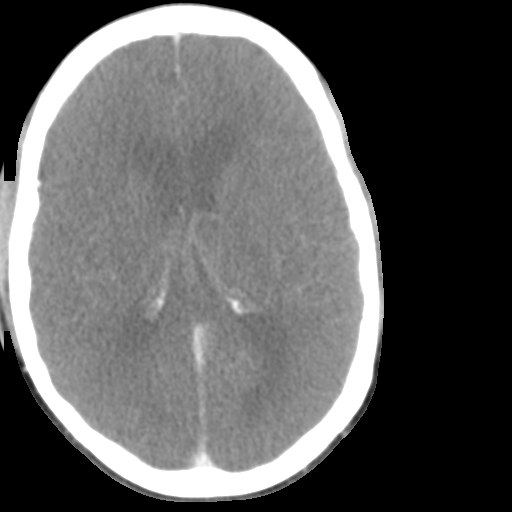
[im 345/690  soft-tissue]
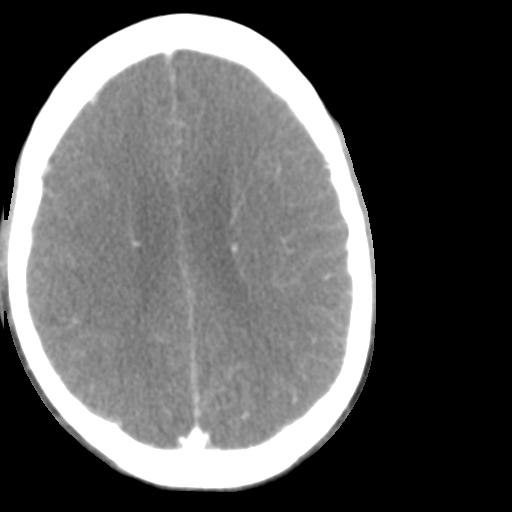
[im 460/690  soft-tissue]
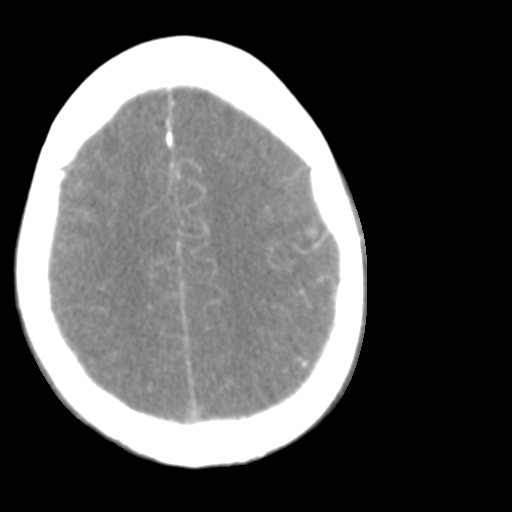
[im 575/690  soft-tissue]
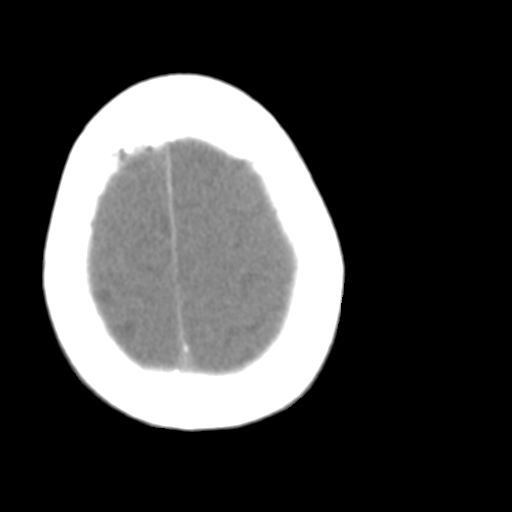

[9 of 36 positions shown; findings below may reference images not displayed]

FINDINGS: CTA NECK FINDINGS

Aortic arch: Visualized aortic arch normal caliber with normal
branch pattern. Mild atheromatous change about the arch itself. No
stenosis about the origin the great vessels.

Right carotid system: Right CCA patent to the bifurcation without
stenosis. Scattered mixed plaque about the right carotid
bulb/proximal right ICA without hemodynamically significant
stenosis. Right ICA patent distally without stenosis, dissection or
occlusion.

Left carotid system: Left CCA patent without stenosis. Mild plaque
about the origin of the cervical left ICA without significant
stenosis. Left ICA patent distally without stenosis, dissection or
occlusion.

Vertebral arteries: Both vertebral arteries arise from subclavian
arteries. No proximal subclavian artery stenosis. Dominant left
vertebral artery with diffusely hypoplastic right vertebral artery.
Vertebral arteries patent without stenosis, dissection or occlusion.

Skeleton: No discrete or worrisome osseous lesions. Mild to moderate
multilevel cervical spondylosis, most pronounced at C5-6 and C6-7.
Mild chronic wedging deformity noted at the superior endplates of T2
and T3. No visible acute osseous finding.

Other neck: No other acute soft tissue abnormality within the neck.
Chronic fatty atrophy of the right parotid gland noted. 1.6 cm right
thyroid nodule (series 4, image 48), indeterminate. No other mass or
adenopathy.

Upper chest: Visualized upper chest demonstrates no acute finding.

Review of the MIP images confirms the above findings

CTA HEAD FINDINGS

Anterior circulation: Petrous segments patent bilaterally. Scattered
atheromatous change within the carotid siphons with no more than
mild multifocal narrowing. 2 mm outpouching arising from the
supraclinoid right ICA favored to reflect a vascular infundibulum,
although small aneurysm difficult to exclude (series 8, image 104).
A similar 2 mm outpouching seen at the contralateral supraclinoid
left ICA (series 8, image 128), also favored to reflect a small
vascular infundibulum, although aneurysm again difficult to exclude.

A1 segments patent bilaterally. Normal anterior communicating artery
complex. Anterior cerebral arteries patent without stenosis. No M1
stenosis or occlusion. Normal MCA bifurcations. Distal MCA branches
perfused and symmetric.

Posterior circulation: Dominant left vertebral artery widely patent
to the vertebrobasilar junction. Hypoplastic right vertebral artery
patent as well. Neither PICA origin well visualized. Basilar patent
to its distal aspect without stenosis. Superior cerebellar arteries
patent bilaterally. Both PCAs primarily supplied via the basilar.
PCAs patent to their distal aspects without flow-limiting stenosis.

Venous sinuses: Patent allowing for timing the contrast bolus.

Anatomic variants: Strongly dominant left vertebral artery with
diffusely hypoplastic right vertebral artery. No aneurysm.

Review of the MIP images confirms the above findings
IMPRESSION: 1. Negative CTA for large vessel occlusion.
2. Mild atherosclerotic change about the carotid bifurcations and
carotid siphons without hemodynamically significant stenosis.
3. 2 mm outpouchings arising from the supraclinoid ICAs bilaterally,
favored to reflect vascular infundibula, although small aneurysms
difficult to exclude. Attention at follow-up recommended.
4. 1.6 cm right thyroid nodule, indeterminate. Further evaluation
with dedicated thyroid ultrasound recommended. This could be
performed on a nonemergent outpatient basis. (ref: [HOSPITAL].
[DATE]): 143-50).

## 2022-03-02 IMAGING — CT CT HEAD CODE STROKE
3 of 4 series · 15 of 47 positions shown, 18 images · non-contrast
Comparison: Prior study from 09/25/2017.

CLINICAL DATA: Code stroke.

EXAM:
CT HEAD WITHOUT CONTRAST
TECHNIQUE: Contiguous axial images were obtained from the base of the skull
through the vertex without intravenous contrast.

[Series 5: head wo · axial · 0.44mm/px · z∈[-49,+91]mm · 9 of 34 slices shown, 12 images]
[im 3/34  brain]
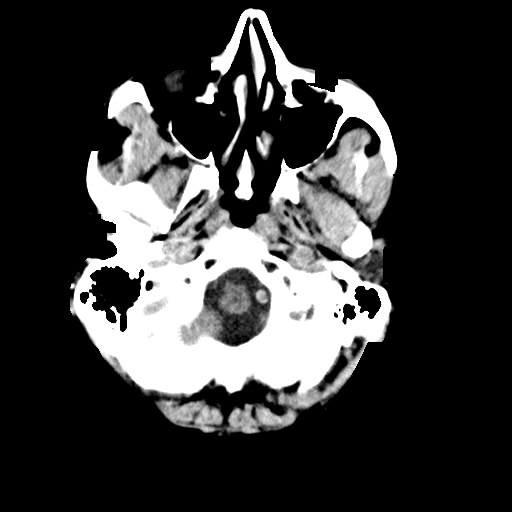
[im 3/34  bone]
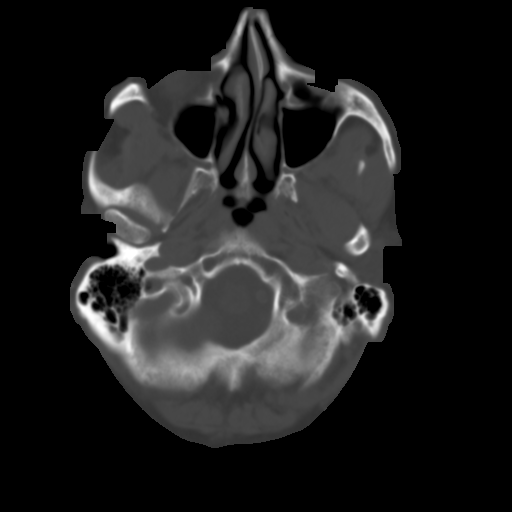
[im 8/34  brain]
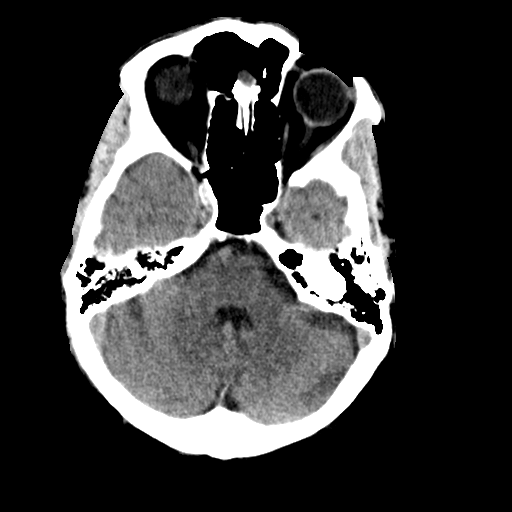
[im 10/34  brain]
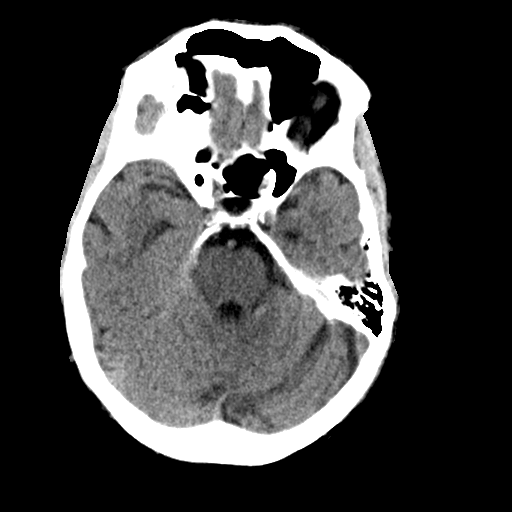
[im 15/34  brain]
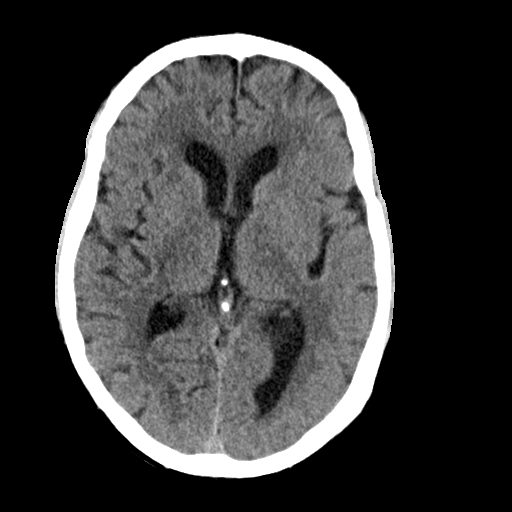
[im 17/34  brain]
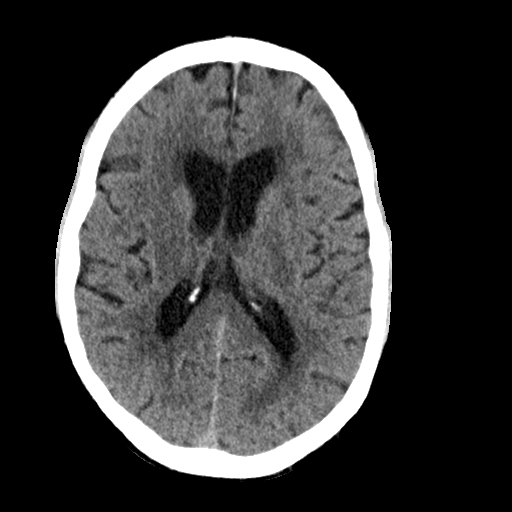
[im 17/34  bone]
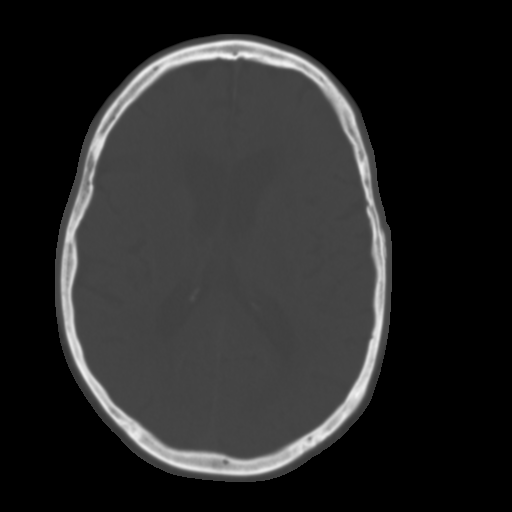
[im 19/34  brain]
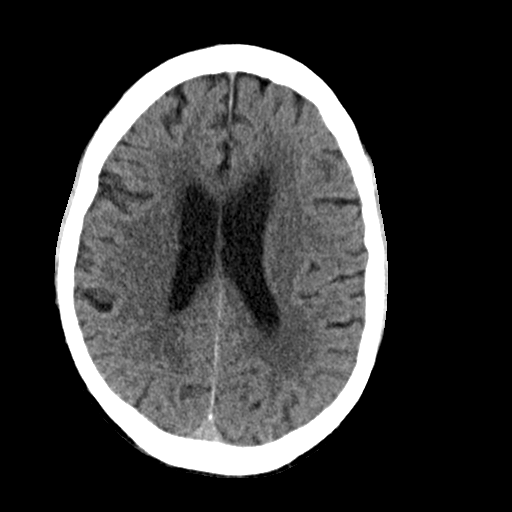
[im 24/34  brain]
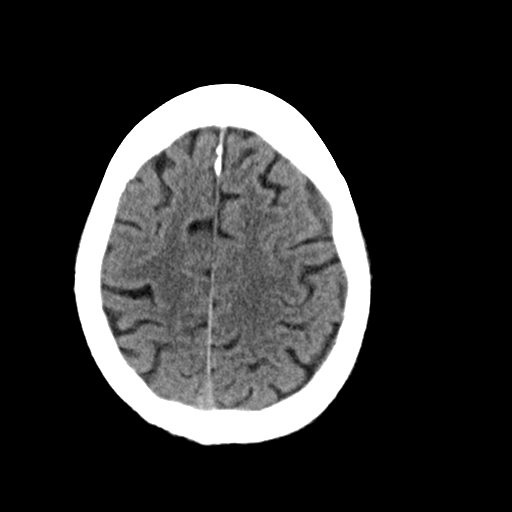
[im 26/34  brain]
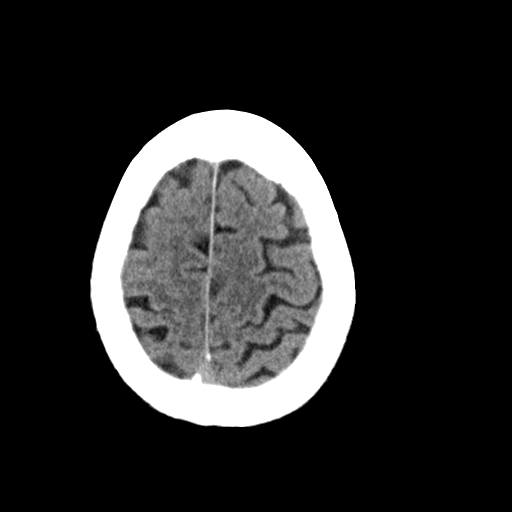
[im 31/34  brain]
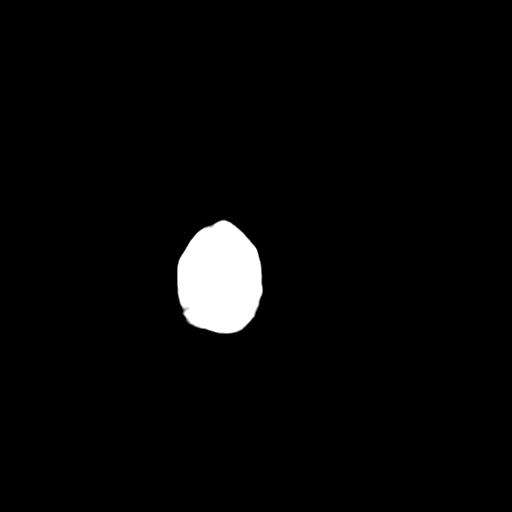
[im 31/34  bone]
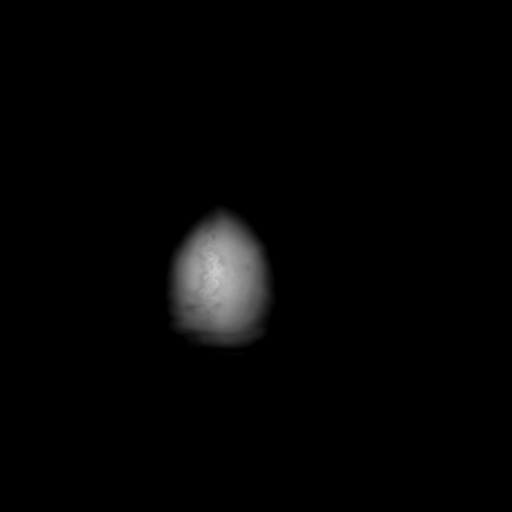

[Series 6: coronal soft · coronal · 0.31mm/px · 3 of 68 slices shown]
[im 23/68  brain]
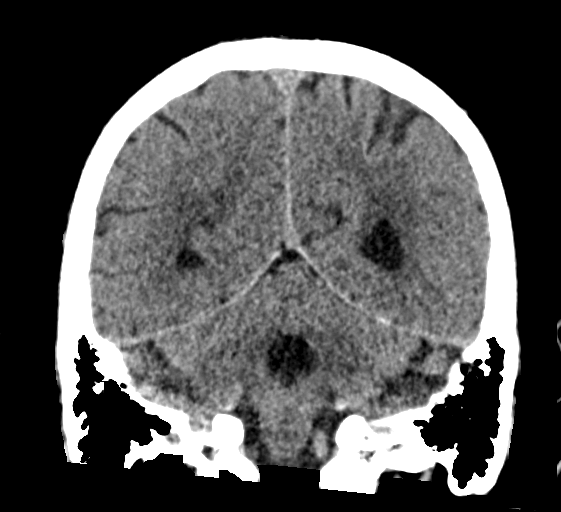
[im 30/68  brain]
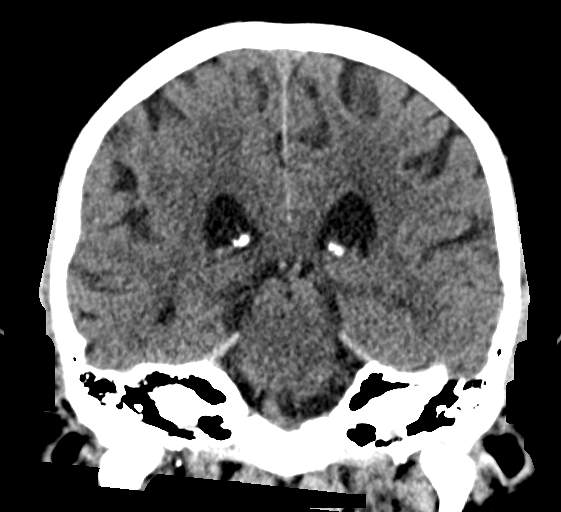
[im 38/68  brain]
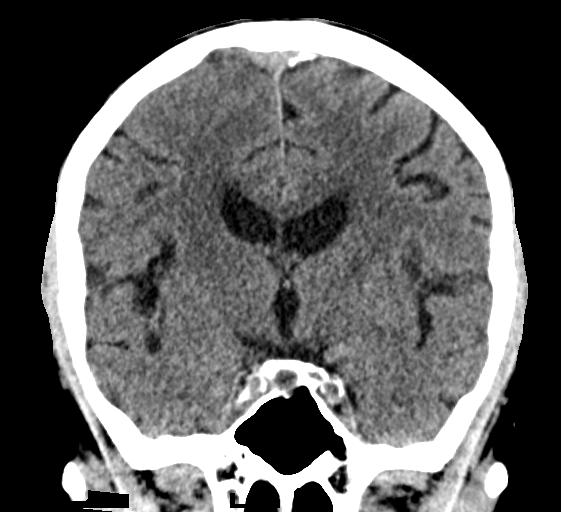

[Series 7: sagittal soft · sagittal · 0.31mm/px · 3 of 58 slices shown]
[im 20/58  brain]
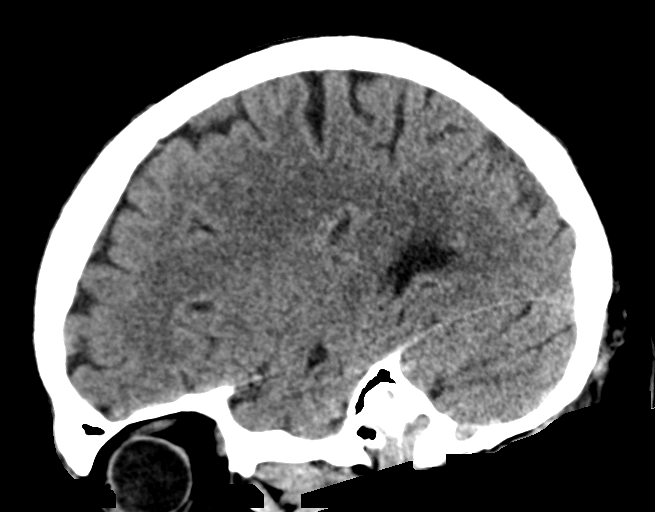
[im 29/58  brain]
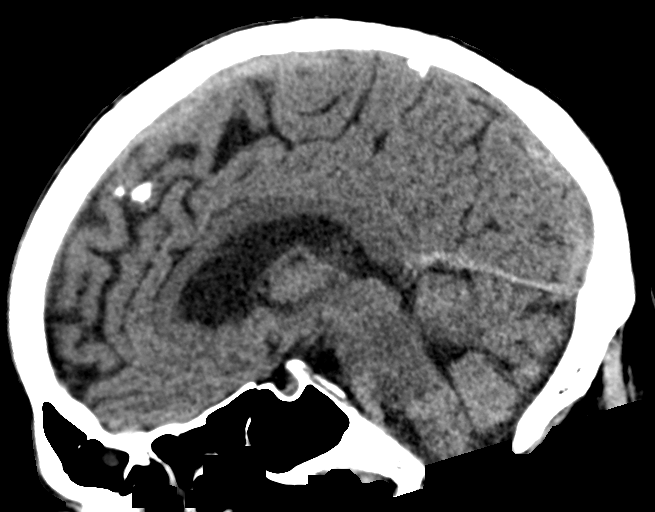
[im 39/58  brain]
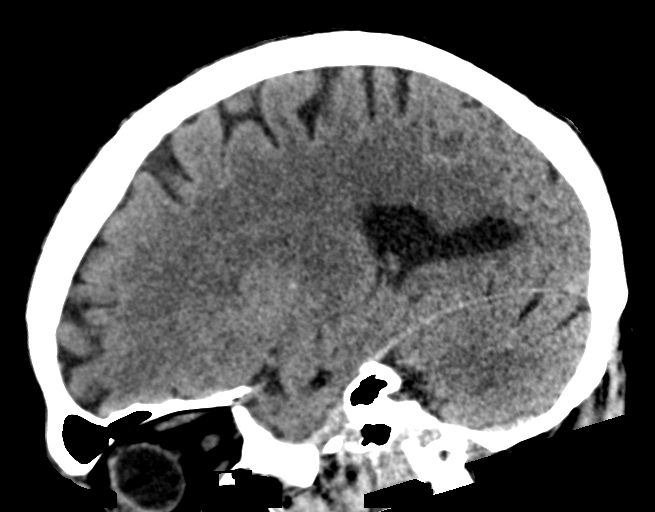

[15 of 47 positions shown; findings below may reference images not displayed]

FINDINGS: Brain: Age-related cerebral atrophy with chronic small vessel
ischemic disease. No acute intracranial hemorrhage. No acute large
vessel territory infarct. No mass lesion, midline shift or mass
effect. No hydrocephalus or extra-axial fluid collection.

Vascular: No hyperdense vessel. Calcified atherosclerosis present at
skull base.

Skull: Scalp soft tissues within normal limits.  Calvarium intact.

Sinuses/Orbits: Globes orbital soft tissues within normal limits.
Scattered mucosal thickening noted within the ethmoidal air cells.
Paranasal sinuses and mastoid air cells are otherwise clear.

Other: None.

ASPECTS (Alberta Stroke Program Early CT Score)

- Ganglionic level infarction (caudate, lentiform nuclei, internal
capsule, insula, M1-M3 cortex): 7

- Supraganglionic infarction (M4-M6 cortex): 3

Total score (0-10 with 10 being normal): 10
IMPRESSION: 1. No acute intracranial infarct or other abnormality.
2. ASPECTS is 10.
3. Age-related cerebral atrophy with chronic small vessel ischemic
disease.

Critical Value/emergent results were called by telephone at the time
of interpretation on 09/05/2021 at [DATE] to provider PETIT
WONODI , who verbally acknowledged these results.

## 2022-03-03 IMAGING — US US THYROID
1 series · 13 of 25 positions shown · non-contrast
Comparison: CT scan 09/05/2021

CLINICAL DATA: Incidental on CT.

EXAM:
THYROID ULTRASOUND
TECHNIQUE: Ultrasound examination of the thyroid gland and adjacent soft
tissues was performed.

[Series 1: us thyroid · 32 acquisitions, 13 frames shown]
[im 1/32]
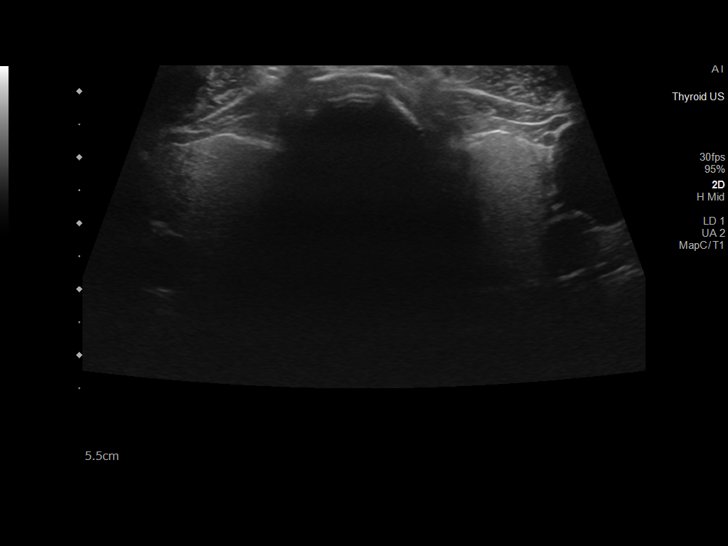
[im 3/32]
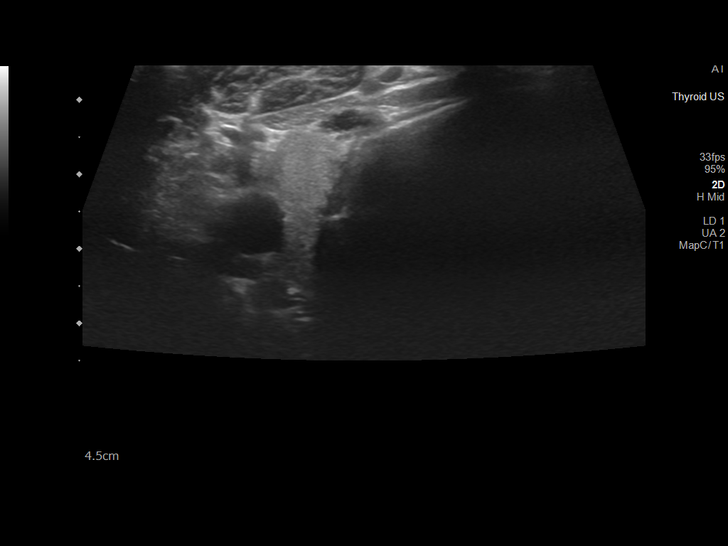
[im 6/32]
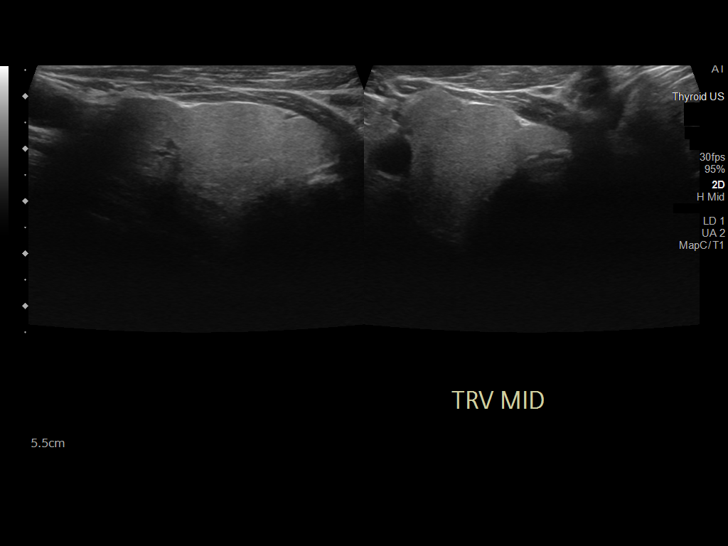
[im 8/32]
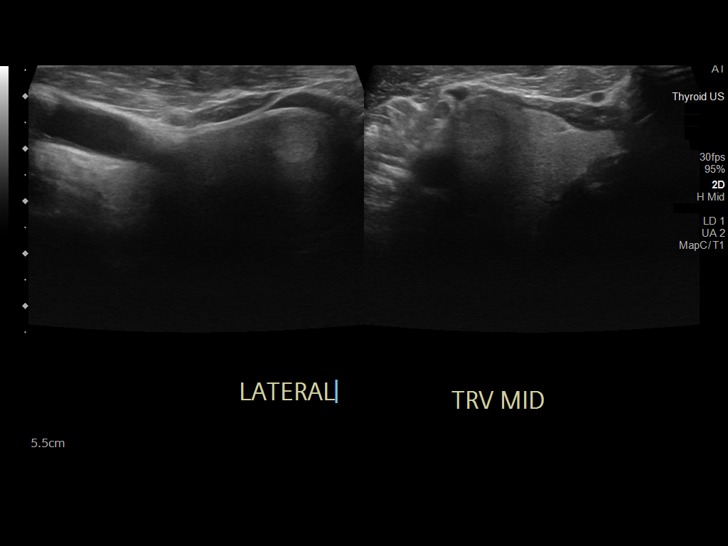
[im 11/32]
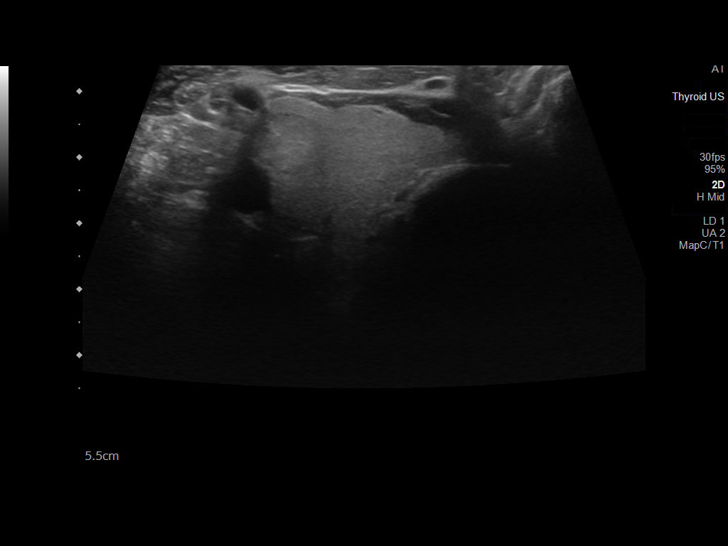
[im 13/32]
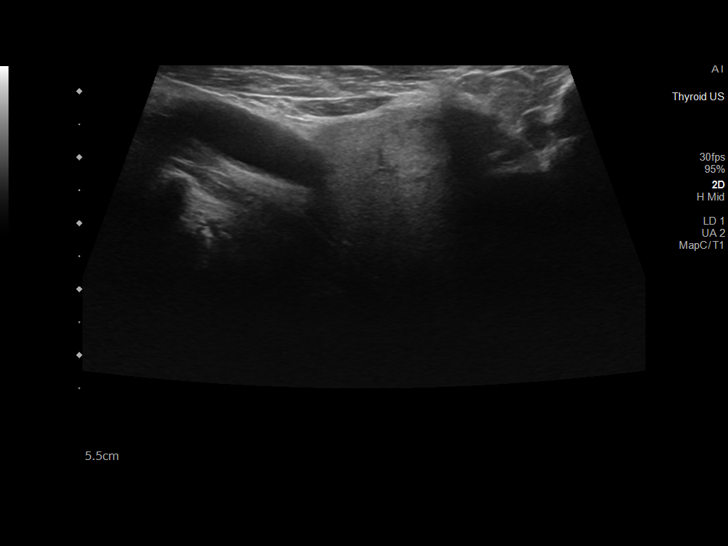
[im 16/32]
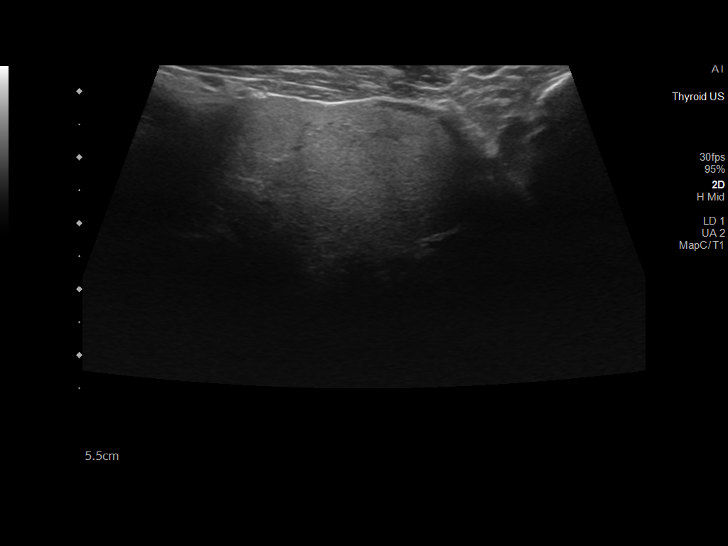
[im 19/32]
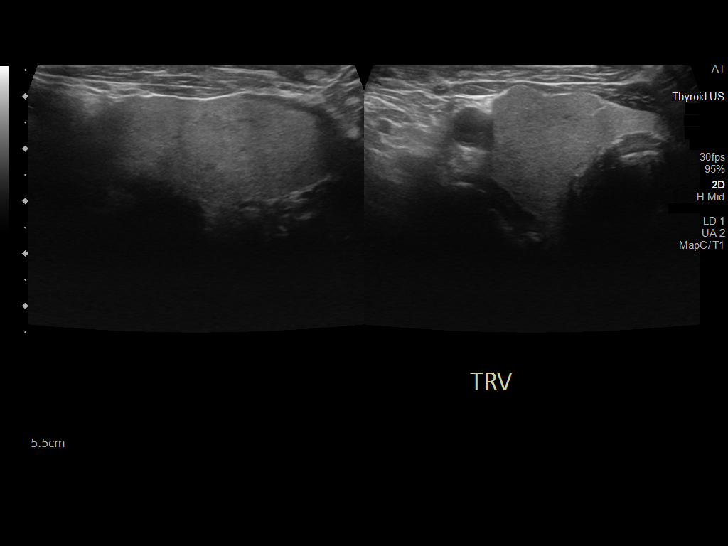
[im 21/32]
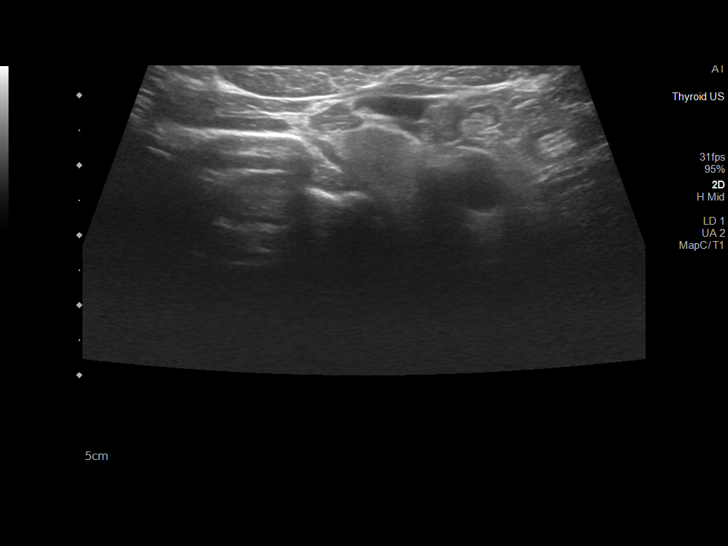
[im 24/32]
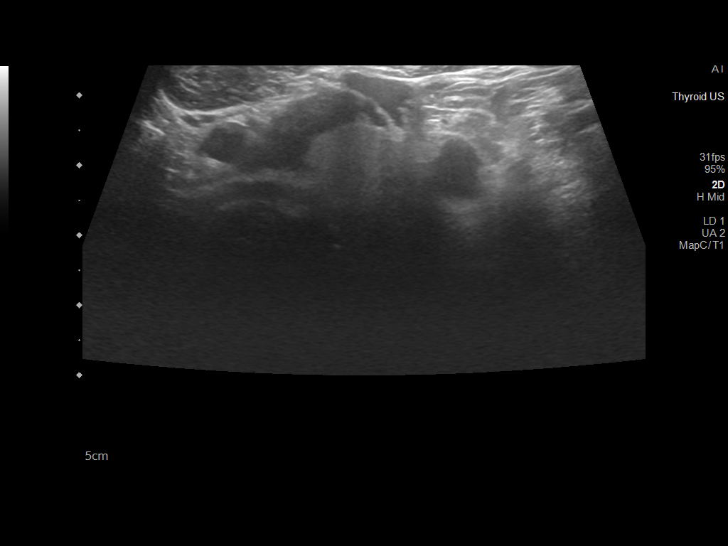
[im 26/32]
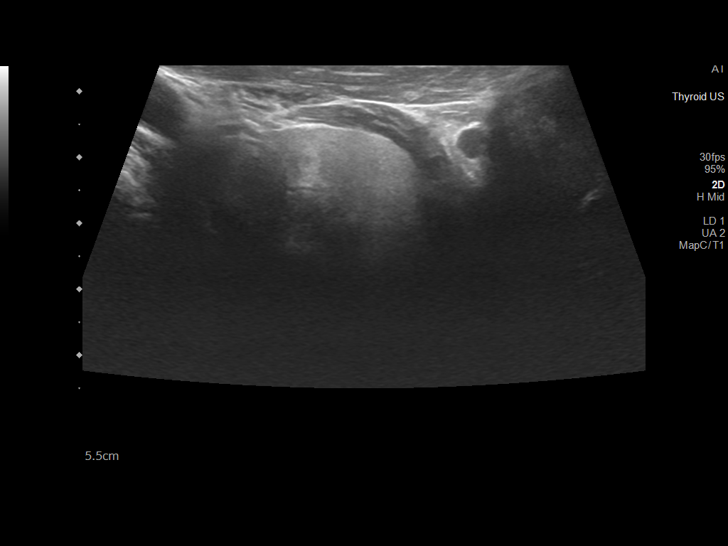
[im 29/32]
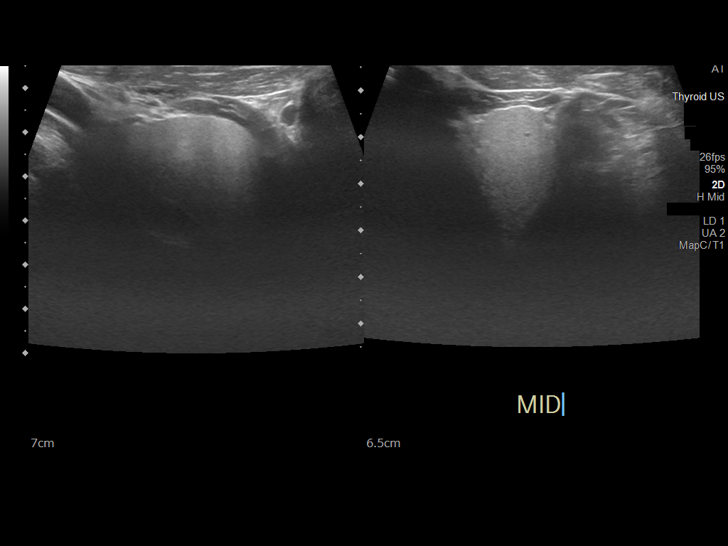
[im 32/32]
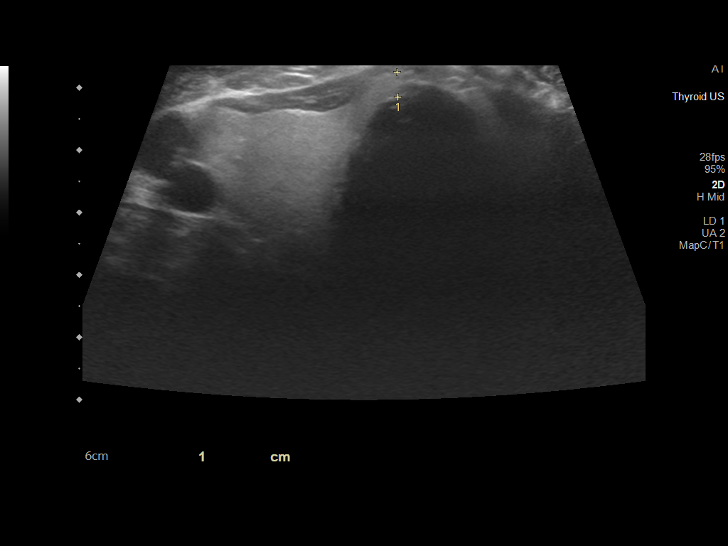

[13 of 25 positions shown; findings below may reference images not displayed]

FINDINGS: Parenchymal Echotexture: Normal

Isthmus: 0.4 cm

Right lobe: 5.0 x 2.1 x 2.4 cm

Left lobe: 4.5 x 2.4 x 2.1 cm

_________________________________________________________

Estimated total number of nodules >/= 1 cm: 2

Number of spongiform nodules >/=  2 cm not described below (TR1): 0

Number of mixed cystic and solid nodules >/= 1.5 cm not described
below (TR2): 0

_________________________________________________________

Nodule # 1: Small 1 cm isoechoic solid nodule in the right upper
gland does not meet criteria to warrant further evaluation.

Nodule # 2:

Location: Right; Mid

Maximum size: 2.0 cm; Other 2 dimensions: 1.8 x 1.7 cm

Composition: solid/almost completely solid (2)

Echogenicity: isoechoic (1)

Shape: not taller-than-wide (0)

Margins: ill-defined (0)

Echogenic foci: none (0)

ACR TI-RADS total points: 3.

ACR TI-RADS risk category: TR3 (3 points).

ACR TI-RADS recommendations:

*Given size (>/= 1.5 - 2.4 cm) and appearance, a follow-up
ultrasound in 1 year should be considered based on TI-RADS criteria.

_________________________________________________________
IMPRESSION: 1. Extremely subtle 2 cm TI-RADS category 3 nodule in the right mid
gland corresponds with the finding on prior CT imaging. This nodule
meets criteria for imaging surveillance. Recommend follow-up
ultrasound in 1 year.

The above is in keeping with the ACR TI-RADS recommendations - [HOSPITAL] 8423;[DATE].

## 2022-04-23 DIAGNOSIS — R110 Nausea: Secondary | ICD-10-CM | POA: Diagnosis not present

## 2022-04-23 DIAGNOSIS — K219 Gastro-esophageal reflux disease without esophagitis: Secondary | ICD-10-CM | POA: Diagnosis not present

## 2022-04-23 DIAGNOSIS — I10 Essential (primary) hypertension: Secondary | ICD-10-CM | POA: Diagnosis not present

## 2022-04-23 DIAGNOSIS — R531 Weakness: Secondary | ICD-10-CM | POA: Diagnosis not present

## 2022-04-23 DIAGNOSIS — E7800 Pure hypercholesterolemia, unspecified: Secondary | ICD-10-CM | POA: Diagnosis not present

## 2022-04-23 DIAGNOSIS — M170 Bilateral primary osteoarthritis of knee: Secondary | ICD-10-CM | POA: Diagnosis not present

## 2022-05-21 DIAGNOSIS — Z0000 Encounter for general adult medical examination without abnormal findings: Secondary | ICD-10-CM | POA: Diagnosis not present

## 2022-06-11 DIAGNOSIS — R1013 Epigastric pain: Secondary | ICD-10-CM | POA: Diagnosis not present

## 2022-07-09 DIAGNOSIS — K219 Gastro-esophageal reflux disease without esophagitis: Secondary | ICD-10-CM | POA: Diagnosis not present

## 2022-07-09 DIAGNOSIS — E7800 Pure hypercholesterolemia, unspecified: Secondary | ICD-10-CM | POA: Diagnosis not present

## 2022-07-09 DIAGNOSIS — I10 Essential (primary) hypertension: Secondary | ICD-10-CM | POA: Diagnosis not present

## 2022-07-09 DIAGNOSIS — G8929 Other chronic pain: Secondary | ICD-10-CM | POA: Diagnosis not present

## 2022-07-24 DIAGNOSIS — Z86010 Personal history of colonic polyps: Secondary | ICD-10-CM | POA: Diagnosis not present

## 2022-07-24 DIAGNOSIS — D649 Anemia, unspecified: Secondary | ICD-10-CM | POA: Diagnosis not present

## 2022-07-24 DIAGNOSIS — K219 Gastro-esophageal reflux disease without esophagitis: Secondary | ICD-10-CM | POA: Diagnosis not present

## 2023-01-02 DIAGNOSIS — M2550 Pain in unspecified joint: Secondary | ICD-10-CM | POA: Diagnosis not present

## 2023-01-02 DIAGNOSIS — R300 Dysuria: Secondary | ICD-10-CM | POA: Diagnosis not present

## 2023-01-02 DIAGNOSIS — Z6822 Body mass index (BMI) 22.0-22.9, adult: Secondary | ICD-10-CM | POA: Diagnosis not present

## 2023-01-02 DIAGNOSIS — I10 Essential (primary) hypertension: Secondary | ICD-10-CM | POA: Diagnosis not present

## 2023-02-11 DIAGNOSIS — Z85528 Personal history of other malignant neoplasm of kidney: Secondary | ICD-10-CM | POA: Diagnosis not present

## 2023-02-13 DIAGNOSIS — D49511 Neoplasm of unspecified behavior of right kidney: Secondary | ICD-10-CM | POA: Diagnosis not present

## 2023-02-13 DIAGNOSIS — I700 Atherosclerosis of aorta: Secondary | ICD-10-CM | POA: Diagnosis not present

## 2023-02-13 DIAGNOSIS — K5730 Diverticulosis of large intestine without perforation or abscess without bleeding: Secondary | ICD-10-CM | POA: Diagnosis not present

## 2023-02-18 DIAGNOSIS — R8271 Bacteriuria: Secondary | ICD-10-CM | POA: Diagnosis not present

## 2023-02-18 DIAGNOSIS — D49511 Neoplasm of unspecified behavior of right kidney: Secondary | ICD-10-CM | POA: Diagnosis not present

## 2023-03-05 DIAGNOSIS — H40053 Ocular hypertension, bilateral: Secondary | ICD-10-CM | POA: Diagnosis not present

## 2023-03-05 DIAGNOSIS — H04123 Dry eye syndrome of bilateral lacrimal glands: Secondary | ICD-10-CM | POA: Diagnosis not present

## 2023-03-05 DIAGNOSIS — H5203 Hypermetropia, bilateral: Secondary | ICD-10-CM | POA: Diagnosis not present

## 2023-03-05 DIAGNOSIS — Z961 Presence of intraocular lens: Secondary | ICD-10-CM | POA: Diagnosis not present

## 2023-03-10 DIAGNOSIS — R8271 Bacteriuria: Secondary | ICD-10-CM | POA: Diagnosis not present

## 2023-04-17 DIAGNOSIS — J069 Acute upper respiratory infection, unspecified: Secondary | ICD-10-CM | POA: Diagnosis not present

## 2023-04-20 DIAGNOSIS — U071 COVID-19: Secondary | ICD-10-CM | POA: Diagnosis not present

## 2023-04-27 DIAGNOSIS — M170 Bilateral primary osteoarthritis of knee: Secondary | ICD-10-CM | POA: Diagnosis not present

## 2023-04-27 DIAGNOSIS — G8929 Other chronic pain: Secondary | ICD-10-CM | POA: Diagnosis not present

## 2023-04-27 DIAGNOSIS — K219 Gastro-esophageal reflux disease without esophagitis: Secondary | ICD-10-CM | POA: Diagnosis not present

## 2023-04-27 DIAGNOSIS — E7800 Pure hypercholesterolemia, unspecified: Secondary | ICD-10-CM | POA: Diagnosis not present

## 2023-04-27 DIAGNOSIS — Z6824 Body mass index (BMI) 24.0-24.9, adult: Secondary | ICD-10-CM | POA: Diagnosis not present

## 2023-04-27 DIAGNOSIS — I10 Essential (primary) hypertension: Secondary | ICD-10-CM | POA: Diagnosis not present

## 2023-05-12 DIAGNOSIS — Z85828 Personal history of other malignant neoplasm of skin: Secondary | ICD-10-CM | POA: Diagnosis not present

## 2023-05-12 DIAGNOSIS — L570 Actinic keratosis: Secondary | ICD-10-CM | POA: Diagnosis not present

## 2023-05-12 DIAGNOSIS — L720 Epidermal cyst: Secondary | ICD-10-CM | POA: Diagnosis not present

## 2023-05-25 DIAGNOSIS — E663 Overweight: Secondary | ICD-10-CM | POA: Diagnosis not present

## 2023-05-25 DIAGNOSIS — Z0000 Encounter for general adult medical examination without abnormal findings: Secondary | ICD-10-CM | POA: Diagnosis not present

## 2023-05-25 DIAGNOSIS — Z1389 Encounter for screening for other disorder: Secondary | ICD-10-CM | POA: Diagnosis not present

## 2023-06-03 DIAGNOSIS — K4090 Unilateral inguinal hernia, without obstruction or gangrene, not specified as recurrent: Secondary | ICD-10-CM | POA: Diagnosis not present

## 2023-06-03 NOTE — Progress Notes (Signed)
 [facility]    REFERRING PHYSICIAN:  Dr. Cyrena  PROVIDER:  DEWARD PURCHASE STECHSCHULTE, MD  MRN: [id number] DOB: [birth year] DATE OF ENCOUNTER: 06/03/2023  Subjective   Chief Complaint: Left inguinal hernia    History of Present Illness: Bernard Young is a 82 y.o. male who is seen today as an office consultation at the request of Dr. Cyrena for evaluation of left inguinal hernia.     Notes from previous visits: He has a bulge in his left groin that he is worried about but does not really cause him any pain discomfort or symptoms.  He spends most of his time reading and is limited by orthopedic injuries as far as activities go so the hernia is really not limiting his activities.  He still really is not bothered by this hernia.  It does not limit his activities.  He feels it is about the same as it was 6 months ago without any major change.  Notes from today: Still is not bothered by the hernia.  Review of Systems: A complete review of systems was obtained from the patient.  I have reviewed this information and discussed as appropriate with the patient.  See HPI as well for other ROS.  ROS    Medical History: Past Medical History:  Diagnosis Date  . Arthritis   . GERD (gastroesophageal reflux disease)   . History of cancer   . History of stroke     There is no problem list on file for this patient.   No past surgical history on file.   Allergies  Allergen Reactions  . Sulfa (Sulfonamide Antibiotics) Other (See Comments)    Unknown childhood reaction  . Penicillins Rash    Has patient had a PCN reaction causing immediate rash, facial/tongue/throat swelling, SOB or lightheadedness with hypotension: No Has patient had a PCN reaction causing severe rash involving mucus membranes or skin necrosis: No Has patient had a PCN reaction that required hospitalization: No Has patient had a PCN reaction occurring within the last 10 years: No If all of the above answers are NO, then may  proceed with Cephalosporin use.    Current Outpatient Medications on File Prior to Visit  Medication Sig Dispense Refill  . aspirin  81 MG EC tablet 1 tablet    . atorvastatin  (LIPITOR) 40 MG tablet 1 tablet    . famotidine (PEPCID) 40 MG tablet TAKE 1 TABLET BY MOUTH EVERY DAY AT BEDTIME FOR 30 DAYS    . HYDROcodone -acetaminophen  (NORCO) 5-325 mg tablet TAKE 1-2 TABLETS BY MOUTH DAILY AS NEEDED    . losartan  (COZAAR ) 50 MG tablet 1 tablet    . ondansetron  (ZOFRAN ) 4 MG tablet Take 4 mg by mouth once daily as needed    . allopurinoL  (ZYLOPRIM ) 100 MG tablet TAKE 1 TABLET BY MOUTH EVERY DAY FOR 30 DAYS    . triamcinolone  0.1 % cream APPLY 1 APPLICATION TO AFFECTED AREA EXTERNALLY TWICE A DAY FOR 10 DAYS (Patient not taking: Reported on 06/03/2023)     No current facility-administered medications on file prior to visit.    Family History  Problem Relation Age of Onset  . High blood pressure (Hypertension) Mother   . Breast cancer Mother   . Coronary Artery Disease (Blocked arteries around heart) Father      Social History   Tobacco Use  Smoking Status Never  Smokeless Tobacco Never     Social History   Socioeconomic History  . Marital status: Married  Tobacco Use  .  Smoking status: Never  . Smokeless tobacco: Never  Vaping Use  . Vaping status: Never Used  Substance and Sexual Activity  . Alcohol use: Not Currently  . Drug use: Defer  . Sexual activity: Never    Objective:    Vitals:   06/03/23 1537  PainSc: 0-No pain    There is no height or weight on file to calculate BMI.  Physical Exam   General appearance - Consistent with stated age. Normal posture. Voice Normal. Mental status - alert and oriented Integumentary - No rash or lesion on limited exam Head - Normocephalic, atraumatic Face - Strength and tone intact Eyes - extraocular movement intact, sclera anicteric Chest - quiet, even and easy respiratory effort with no use of accessory  muscles Neurological - able to articulate well with normal speech/language, rate, volume and coherence. Mood/affect - normal Judgement and insight -  insight is appropriate concerning matters relevant to self and the patient displays appropriate judgment regarding every day activities. Thought Processes/Cognitive Function - aware of current events. Musculoskeletal - strength symmetrical throughout, no deformity Abdomen -palpable inguinal hernia in the left groin with bulging out of the external inguinal ring.  Easily reducible with patient laying flat.  No major changes from previous exam   Labs, Imaging and Diagnostic Testing: None  Assessment and Plan:  Diagnoses and all orders for this visit:  Left inguinal hernia      We discussed his inguinal hernia in detail as well as the options to proceed with surgery versus observation.  We discussed observation of a asymptomatic inguinal hernia and how this is a safe option with a low risk of acute hernia emergency.  At this time he would like to continue to continue to avoid surgery and continue with observation and will see us  back in 6 months.  I gave him instructions if he were to develop symptoms from this hernia to call the office to be seen sooner.  Return in about 6 months (around 12/01/2023).  PAUL JEFFREY STECHSCHULTE, MD

## 2023-08-24 DIAGNOSIS — Z6825 Body mass index (BMI) 25.0-25.9, adult: Secondary | ICD-10-CM | POA: Diagnosis not present

## 2023-08-24 DIAGNOSIS — M25559 Pain in unspecified hip: Secondary | ICD-10-CM | POA: Diagnosis not present

## 2023-08-24 DIAGNOSIS — G8929 Other chronic pain: Secondary | ICD-10-CM | POA: Diagnosis not present

## 2023-08-24 DIAGNOSIS — Z8739 Personal history of other diseases of the musculoskeletal system and connective tissue: Secondary | ICD-10-CM | POA: Diagnosis not present

## 2023-12-02 DIAGNOSIS — Z6825 Body mass index (BMI) 25.0-25.9, adult: Secondary | ICD-10-CM | POA: Diagnosis not present

## 2023-12-02 DIAGNOSIS — M1611 Unilateral primary osteoarthritis, right hip: Secondary | ICD-10-CM | POA: Diagnosis not present

## 2023-12-02 DIAGNOSIS — R109 Unspecified abdominal pain: Secondary | ICD-10-CM | POA: Diagnosis not present

## 2024-02-09 DIAGNOSIS — D49511 Neoplasm of unspecified behavior of right kidney: Secondary | ICD-10-CM | POA: Diagnosis not present

## 2024-02-25 DIAGNOSIS — K219 Gastro-esophageal reflux disease without esophagitis: Secondary | ICD-10-CM | POA: Diagnosis not present

## 2024-02-25 DIAGNOSIS — R1010 Upper abdominal pain, unspecified: Secondary | ICD-10-CM | POA: Diagnosis not present

## 2024-02-25 DIAGNOSIS — K5900 Constipation, unspecified: Secondary | ICD-10-CM | POA: Diagnosis not present

## 2024-02-25 DIAGNOSIS — E739 Lactose intolerance, unspecified: Secondary | ICD-10-CM | POA: Diagnosis not present

## 2024-03-09 DIAGNOSIS — I10 Essential (primary) hypertension: Secondary | ICD-10-CM | POA: Diagnosis not present

## 2024-03-09 DIAGNOSIS — F1190 Opioid use, unspecified, uncomplicated: Secondary | ICD-10-CM | POA: Diagnosis not present

## 2024-03-09 DIAGNOSIS — Z6824 Body mass index (BMI) 24.0-24.9, adult: Secondary | ICD-10-CM | POA: Diagnosis not present

## 2024-03-09 DIAGNOSIS — L989 Disorder of the skin and subcutaneous tissue, unspecified: Secondary | ICD-10-CM | POA: Diagnosis not present

## 2024-03-09 DIAGNOSIS — R944 Abnormal results of kidney function studies: Secondary | ICD-10-CM | POA: Diagnosis not present

## 2024-03-09 DIAGNOSIS — G8929 Other chronic pain: Secondary | ICD-10-CM | POA: Diagnosis not present

## 2024-03-15 DIAGNOSIS — I872 Venous insufficiency (chronic) (peripheral): Secondary | ICD-10-CM | POA: Diagnosis not present

## 2024-03-15 DIAGNOSIS — M1611 Unilateral primary osteoarthritis, right hip: Secondary | ICD-10-CM | POA: Diagnosis not present

## 2024-03-18 DIAGNOSIS — K5900 Constipation, unspecified: Secondary | ICD-10-CM | POA: Diagnosis not present

## 2024-03-18 DIAGNOSIS — K219 Gastro-esophageal reflux disease without esophagitis: Secondary | ICD-10-CM | POA: Diagnosis not present

## 2024-03-18 DIAGNOSIS — E739 Lactose intolerance, unspecified: Secondary | ICD-10-CM | POA: Diagnosis not present

## 2024-03-18 DIAGNOSIS — R1010 Upper abdominal pain, unspecified: Secondary | ICD-10-CM | POA: Diagnosis not present

## 2024-03-30 DIAGNOSIS — Z6824 Body mass index (BMI) 24.0-24.9, adult: Secondary | ICD-10-CM | POA: Diagnosis not present

## 2024-03-30 DIAGNOSIS — K4090 Unilateral inguinal hernia, without obstruction or gangrene, not specified as recurrent: Secondary | ICD-10-CM | POA: Diagnosis not present

## 2024-03-30 DIAGNOSIS — K645 Perianal venous thrombosis: Secondary | ICD-10-CM | POA: Diagnosis not present

## 2024-03-30 DIAGNOSIS — M169 Osteoarthritis of hip, unspecified: Secondary | ICD-10-CM | POA: Diagnosis not present

## 2024-04-27 DIAGNOSIS — M1990 Unspecified osteoarthritis, unspecified site: Secondary | ICD-10-CM | POA: Diagnosis not present

## 2024-04-27 DIAGNOSIS — I10 Essential (primary) hypertension: Secondary | ICD-10-CM | POA: Diagnosis not present

## 2024-04-27 DIAGNOSIS — R609 Edema, unspecified: Secondary | ICD-10-CM | POA: Diagnosis not present

## 2024-04-27 DIAGNOSIS — Z6823 Body mass index (BMI) 23.0-23.9, adult: Secondary | ICD-10-CM | POA: Diagnosis not present

## 2024-04-27 DIAGNOSIS — G8929 Other chronic pain: Secondary | ICD-10-CM | POA: Diagnosis not present

## 2024-04-27 DIAGNOSIS — E7800 Pure hypercholesterolemia, unspecified: Secondary | ICD-10-CM | POA: Diagnosis not present

## 2024-04-27 DIAGNOSIS — L404 Guttate psoriasis: Secondary | ICD-10-CM | POA: Diagnosis not present

## 2024-04-27 DIAGNOSIS — F1190 Opioid use, unspecified, uncomplicated: Secondary | ICD-10-CM | POA: Diagnosis not present

## 2024-04-27 DIAGNOSIS — Z0000 Encounter for general adult medical examination without abnormal findings: Secondary | ICD-10-CM | POA: Diagnosis not present

## 2024-06-02 ENCOUNTER — Other Ambulatory Visit: Payer: Self-pay | Admitting: Surgery

## 2024-06-02 DIAGNOSIS — K4090 Unilateral inguinal hernia, without obstruction or gangrene, not specified as recurrent: Secondary | ICD-10-CM | POA: Diagnosis not present

## 2024-06-03 ENCOUNTER — Encounter (HOSPITAL_BASED_OUTPATIENT_CLINIC_OR_DEPARTMENT_OTHER): Payer: Self-pay | Admitting: Surgery

## 2024-06-06 ENCOUNTER — Other Ambulatory Visit (HOSPITAL_COMMUNITY): Payer: Self-pay | Admitting: Surgery

## 2024-06-06 DIAGNOSIS — R609 Edema, unspecified: Secondary | ICD-10-CM

## 2024-06-07 ENCOUNTER — Ambulatory Visit (HOSPITAL_COMMUNITY)
Admission: RE | Admit: 2024-06-07 | Discharge: 2024-06-07 | Discharge status: Home / Self Care | Source: Ambulatory Visit | Attending: Surgery

## 2024-06-07 DIAGNOSIS — R609 Edema, unspecified: Secondary | ICD-10-CM | POA: Insufficient documentation

## 2024-06-07 DIAGNOSIS — R600 Localized edema: Secondary | ICD-10-CM | POA: Diagnosis not present

## 2024-06-12 ENCOUNTER — Encounter (HOSPITAL_BASED_OUTPATIENT_CLINIC_OR_DEPARTMENT_OTHER): Payer: Self-pay

## 2024-06-12 ENCOUNTER — Other Ambulatory Visit: Payer: Self-pay

## 2024-06-12 ENCOUNTER — Emergency Department (HOSPITAL_BASED_OUTPATIENT_CLINIC_OR_DEPARTMENT_OTHER)

## 2024-06-12 ENCOUNTER — Emergency Department (HOSPITAL_BASED_OUTPATIENT_CLINIC_OR_DEPARTMENT_OTHER)
Admission: EM | Admit: 2024-06-12 | Discharge: 2024-06-12 | Disposition: A | Discharge status: Home / Self Care | Attending: Emergency Medicine | Admitting: Emergency Medicine

## 2024-06-12 DIAGNOSIS — Z7982 Long term (current) use of aspirin: Secondary | ICD-10-CM | POA: Diagnosis not present

## 2024-06-12 DIAGNOSIS — I10 Essential (primary) hypertension: Secondary | ICD-10-CM | POA: Insufficient documentation

## 2024-06-12 DIAGNOSIS — K4091 Unilateral inguinal hernia, without obstruction or gangrene, recurrent: Secondary | ICD-10-CM | POA: Diagnosis not present

## 2024-06-12 DIAGNOSIS — Z79899 Other long term (current) drug therapy: Secondary | ICD-10-CM | POA: Insufficient documentation

## 2024-06-12 DIAGNOSIS — K4090 Unilateral inguinal hernia, without obstruction or gangrene, not specified as recurrent: Secondary | ICD-10-CM | POA: Diagnosis not present

## 2024-06-12 DIAGNOSIS — K5900 Constipation, unspecified: Secondary | ICD-10-CM | POA: Insufficient documentation

## 2024-06-12 LAB — CBC
HCT: 32.6 % — ABNORMAL LOW (ref 39.0–52.0)
Hemoglobin: 11 g/dL — ABNORMAL LOW (ref 13.0–17.0)
MCH: 30.5 pg (ref 26.0–34.0)
MCHC: 33.7 g/dL (ref 30.0–36.0)
MCV: 90.3 fL (ref 80.0–100.0)
Platelets: 242 K/uL (ref 150–400)
RBC: 3.61 MIL/uL — ABNORMAL LOW (ref 4.22–5.81)
RDW: 12.8 % (ref 11.5–15.5)
WBC: 5.9 K/uL (ref 4.0–10.5)
nRBC: 0 % (ref 0.0–0.2)

## 2024-06-12 LAB — URINALYSIS, ROUTINE W REFLEX MICROSCOPIC
Bacteria, UA: NONE SEEN
Bilirubin Urine: NEGATIVE
Glucose, UA: NEGATIVE mg/dL
Ketones, ur: NEGATIVE mg/dL
Leukocytes,Ua: NEGATIVE
Nitrite: NEGATIVE
Protein, ur: NEGATIVE mg/dL
Specific Gravity, Urine: 1.01 (ref 1.005–1.030)
pH: 7 (ref 5.0–8.0)

## 2024-06-12 LAB — COMPREHENSIVE METABOLIC PANEL WITH GFR
ALT: 11 U/L (ref 0–44)
AST: 25 U/L (ref 15–41)
Albumin: 4.2 g/dL (ref 3.5–5.0)
Alkaline Phosphatase: 89 U/L (ref 38–126)
Anion gap: 14 (ref 5–15)
BUN: 18 mg/dL (ref 8–23)
CO2: 23 mmol/L (ref 22–32)
Calcium: 9.6 mg/dL (ref 8.9–10.3)
Chloride: 102 mmol/L (ref 98–111)
Creatinine, Ser: 1.18 mg/dL (ref 0.61–1.24)
GFR, Estimated: >60 mL/min (ref >60)
Glucose, Bld: 98 mg/dL (ref 70–99)
Potassium: 4.6 mmol/L (ref 3.5–5.1)
Sodium: 138 mmol/L (ref 135–145)
Total Bilirubin: 0.6 mg/dL (ref 0.0–1.2)
Total Protein: 7.3 g/dL (ref 6.5–8.1)

## 2024-06-12 LAB — LIPASE, BLOOD: Lipase: 22 U/L (ref 11–51)

## 2024-06-12 NOTE — ED Triage Notes (Signed)
 Pt reports unable to use the bathroom x3 days. Pt reports having L inguinal hernia for several years. Pt denies any hx of bowel obstructions.

## 2024-06-12 NOTE — ED Provider Notes (Signed)
 [place] EMERGENCY DEPARTMENT AT [facility] Provider Note   CSN: [id number] Arrival date & time: 06/12/24  1330     Patient presents with: Abdominal Pain and Constipation   Bernard Young is a 83 y.o. male.   Patient here constipation, hernia pain.  He feels like he has a large stool ball in his rectum but has not been able to pass it.  Denies any weakness numbness tingling.  Denies any nausea vomiting.  He has been passing gas.  He has a known left inguinal hernia that is bothering him as well.  He denies any fevers or chills.  Denies any headache.  Hernias been there for a while.  He seen surgery about this.  Doing watchful waiting.  History of hypertension.  The history is provided by the patient.       Prior to Admission medications   Medication Sig Start Date End Date Taking? Authorizing Provider  allopurinol  (ZYLOPRIM ) 100 MG tablet Take 100 mg by mouth daily. Patient not taking: Reported on 04/15/2021 09/03/17   [provider]  aspirin  EC 81 MG EC tablet Take 1 tablet (81 mg total) by mouth daily. Swallow whole. 09/08/21   Krishnan, Gokul, MD  atorvastatin  (LIPITOR) 40 MG tablet Take 1 tablet (40 mg total) by mouth daily. 09/08/21   Krishnan, Gokul, MD  HYDROcodone -acetaminophen  (NORCO) 5-325 MG tablet Take 1-2 tablets by mouth every 6 (six) hours as needed for moderate pain or severe pain. 10/29/17   Cory Palma, PA-C  losartan  (COZAAR ) 25 MG tablet Take 25 mg by mouth daily. 09/03/17   [provider]  ondansetron  (ZOFRAN ) 4 MG tablet Take 1 tablet (4 mg total) by mouth every 6 (six) hours as needed for nausea or vomiting. 10/31/17   Watt Rush, MD  pantoprazole (PROTONIX) 20 MG tablet Take 20 mg by mouth daily.    [provider]  triamcinolone  cream (KENALOG ) 0.1 % Apply 1 application topically daily as needed (for psoriasis rash).  05/12/17   [provider]    Allergies: Sulfa antibiotics and Penicillins    Review of  Systems  Updated Vital Signs BP (!) 160/84 (BP Location: Right Arm)   Pulse 78   Temp 98.9 F (37.2 C)   Resp 18   Ht 5' 9 (1.753 m)   Wt 71.7 kg   SpO2 100%   BMI 23.33 kg/m   Physical Exam Vitals and nursing note reviewed.  Constitutional:      General: He is not in acute distress.    Appearance: He is well-developed.  HENT:     Head: Normocephalic and atraumatic.  Eyes:     Conjunctiva/sclera: Conjunctivae normal.  Cardiovascular:     Rate and Rhythm: Normal rate and regular rhythm.     Heart sounds: Normal heart sounds. No murmur heard. Pulmonary:     Effort: Pulmonary effort is normal. No respiratory distress.     Breath sounds: Normal breath sounds.  Abdominal:     Palpations: Abdomen is soft.     Tenderness: There is no abdominal tenderness.     Comments: Left inguinal hernia easily reduced  Musculoskeletal:        General: No swelling.     Cervical back: Neck supple.  Skin:    General: Skin is warm and dry.     Capillary Refill: Capillary refill takes less than 2 seconds.  Neurological:     Mental Status: He is alert.  Psychiatric:  Mood and Affect: Mood normal.     (all labs ordered are listed, but only abnormal results are displayed) Labs Reviewed  CBC - Abnormal; Notable for the following components:      Result Value   RBC 3.61 (*)    Hemoglobin 11.0 (*)    HCT 32.6 (*)    All other components within normal limits  URINALYSIS, ROUTINE W REFLEX MICROSCOPIC - Abnormal; Notable for the following components:   Hgb urine dipstick TRACE (*)    All other components within normal limits  LIPASE, BLOOD  COMPREHENSIVE METABOLIC PANEL WITH GFR    EKG: None  Radiology: No results found.   Procedures   Medications Ordered in the ED - No data to display                                  Medical Decision Making Amount and/or Complexity of Data Reviewed Labs: ordered. Radiology: ordered.   Eula Mazzola is here constipation and pain  in his hernia site.  He feels like he has a large stool ball that he needs to pass but is unable to.  History of hypertension.  He does have inguinal hernia on exam is overall well-appearing.  I was able to get this reduced shortly after reducing the hernia he had a very large bowel movement.  He feels incredibly better.  He had already had labs done that showed no significant leukocytosis anemia or electrolyte abnormality.  Urinalysis negative for infection.  Reassuring vitals.  No fever.  Have no concern for bowel obstruction or issue with his hernia otherwise.  Seems like we were able to have a bowel movement after hernia was pushed back and a massage in his abdomen.  He understands to continue using MiraLAX.  At this time we can hold on CT imaging or any further workup as I very low suspicion for bowel obstruction or other acute process.  Discharged in good condition.  Understands return precautions.  This chart was dictated using voice recognition software.  Despite best efforts to proofread,  errors can occur which can change the documentation meaning.      Final diagnoses:  Constipation, unspecified constipation type  Unilateral recurrent inguinal hernia without obstruction or gangrene    ED Discharge Orders     None          Ruthe Cornet, DO 06/12/24 1515

## 2024-06-12 NOTE — Discharge Instructions (Signed)
 Continue MiraLAX for your constipation.  Return if symptoms worsen as we discussed

## 2024-07-05 NOTE — Progress Notes (Signed)
 Sent message, via epic in basket, requesting orders in epic from Careers adviser.

## 2024-07-06 ENCOUNTER — Ambulatory Visit: Payer: Self-pay | Admitting: Surgery

## 2024-07-10 NOTE — Patient Instructions (Signed)
 SURGICAL WAITING ROOM VISITATION Patients having surgery or a procedure may have no more than 2 support people in the waiting area - these visitors may rotate in the visitor waiting room.   If the patient needs to stay at the hospital during part of their recovery, the visitor guidelines for inpatient rooms apply.  PRE-OP VISITATION  Pre-op nurse will coordinate an appropriate time for 1 support person to accompany the patient in pre-op.  This support person may not rotate.  This visitor will be contacted when the time is appropriate for the visitor to come back in the pre-op area.  Please refer to the [facility] website for the visitor guidelines for Inpatients (after your surgery is over and you are in a regular room).  You are not required to quarantine at this time prior to your surgery. However, you must do this: Hand Hygiene often Do NOT share personal items Notify your provider if you are in close contact with someone who has COVID or you develop fever 100.4 or greater, new onset of sneezing, cough, sore throat, shortness of breath or body aches.  If you test positive for Covid or have been in contact with anyone that has tested positive in the last 10 days please notify you surgeon.    Your procedure is scheduled on:  TUESDAY  July 19, 2024  Report to [facility] Main Entrance: [address] entrance where the [employer] is available.   Report to admitting at:  05:15   AM  Call this number if you have any questions or problems the morning of surgery [phone]  FOLLOW ANY ADDITIONAL PRE OP INSTRUCTIONS YOU RECEIVED FROM YOUR SURGEON'S OFFICE!!!  Do not eat food after Midnight the night prior to your surgery/procedure.  After Midnight you may have the following liquids until  04:30 AM DAY OF SURGERY  Clear Liquid Diet Water  Black Coffee (sugar ok, NO MILK/CREAM OR CREAMERS)  Tea (sugar ok, NO MILK/CREAM OR CREAMERS) regular and decaf                              Plain Jell-O  with no fruit (NO RED)                                           Fruit ices (not with fruit pulp, NO RED)                                     Popsicles (NO RED)                                                                  Juice: NO CITRUS JUICES: only apple, WHITE grape, WHITE cranberry Sports drinks like Gatorade or Powerade (NO RED)                Oral Hygiene is also important to reduce your risk of infection.        Remember - BRUSH YOUR TEETH THE MORNING OF SURGERY WITH YOUR REGULAR TOOTHPASTE  Do NOT smoke  after Midnight the night before surgery.  STOP TAKING all Vitamins, Herbs and supplements 1 week before your surgery.   Take ONLY these medicines the morning of surgery with A SIP OF WATER : omeprazole, and Famotidine or Tylenol  if needed.   DO NOT TAKE LOSARTAN  the morning of your surgery.                    You may not have any metal on your body including  jewelry, and body piercing  Do not wear  lotions, powders, cologne, or deodorant  Men may shave face and neck.  Contacts, Hearing Aids, dentures or bridgework may not be worn into surgery. DENTURES WILL BE REMOVED PRIOR TO SURGERY PLEASE DO NOT APPLY Poly grip OR ADHESIVES!!!  Patients discharged on the day of surgery will not be allowed to drive home.  Someone NEEDS to stay with you for the first 24 hours after anesthesia.  Do not bring your home medications to the hospital. The Pharmacy will dispense medications listed on your medication list to you during your admission in the Hospital.  Special Instructions: Bring a copy of your healthcare power of attorney and living will documents the day of surgery, if you wish to have them scanned into your [place] [facility] Records- EPIC  Please read over the following fact sheets you were given: IF YOU HAVE QUESTIONS ABOUT YOUR PRE-OP INSTRUCTIONS, PLEASE CALL [phone]   [facility] - Preparing for Surgery        Before surgery, you can play an  important role.  Because skin is not sterile, your skin needs to be as free of germs as possible.  You can reduce the number of germs on your skin by washing with CHG (chlorahexidine gluconate) soap before surgery.  CHG is an antiseptic cleaner which kills germs and bonds with the skin to continue killing germs even after washing. Please DO NOT use if you have an allergy to CHG or antibacterial soaps.  If your skin becomes reddened/irritated stop using the CHG and inform your nurse when you arrive at Short Stay. Do not shave (including legs and underarms) for at least 48 hours prior to the first CHG shower.  You may shave your face/neck.  Please follow these instructions carefully:  1.  Shower with CHG Soap the night before surgery ONLY (DO NOT USE THE CHG SOAP THE MORNING OF SURGERY).  2.  If you choose to wash your hair, wash your hair first as usual with your normal  shampoo.  3.  After you shampoo, rinse your hair and body thoroughly to remove the shampoo.                             4.  Use CHG as you would any other liquid soap.  You can apply chg directly to the skin and wash.  Gently with a scrungie or clean washcloth.  5.  Apply the CHG Soap to your body ONLY FROM THE NECK DOWN.   Do not use on face/ open                           Wound or open sores. Avoid contact with eyes, ears mouth and genitals (private parts).                       Wash face,  Genitals (private parts) with your normal  soap.             6.  Wash thoroughly, paying special attention to the area where your  surgery  will be performed.  7.  Thoroughly rinse your body with warm water  from the neck down.  8.  DO NOT shower/wash with your normal soap after using and rinsing off the CHG Soap.                9.  Pat yourself dry with a clean towel.            10.  Wear clean pajamas.            11.  Place clean sheets on your bed the night of your first shower and do not  sleep with pets.  Day of Surgery : Do not apply any  CHG, lotions/deodorants the morning of surgery.  Please wear clean clothes to the hospital/surgery center.   FAILURE TO FOLLOW THESE INSTRUCTIONS MAY RESULT IN THE CANCELLATION OF YOUR SURGERY  PATIENT SIGNATURE_________________________________  NURSE SIGNATURE__________________________________  ________________________________________________________________________

## 2024-07-10 NOTE — Progress Notes (Incomplete)
 COVID Vaccine received:  []  No [x]  Yes Date of any COVID positive Test in last 90 days:  none  PCP - C. Dale Gull, MD at [facility] [phone]  Cardiologist - none  Chest x-ray -08-14-2017  2v  Epic  EKG - 2022  repeat  Stress Test -  ECHO - 09-07-2021  Epic Cardiac Cath -  CT Coronary Calcium  score:   Bowel Prep - [x]  No  []   Yes ______  Pacemaker / ICD device [x]  No []  Yes   Spinal Cord Stimulator:[x]  No []  Yes       History of Sleep Apnea? [x]  No []  Yes   CPAP used?- [x]  No []  Yes    Patient has: [x]  NO Hx DM   []  Pre-DM   []  DM1  []   DM2 Does the patient monitor blood sugar?   [x]  N/A   []  No []  Yes  Last A1c was: 5.6  on   09-07-21    Blood Thinner / Instructions:   none Aspirin  Instructions:  ASA 81 mg   Dental hx: []  Dentures:  []  N/A      []  Bridge or Partial: permanent                  [x]  Loose or Damaged teeth:   Activity level: Able to walk up 2 flights of stairs without becoming significantly short of breath or having chest pain?  [x]  No   []    Yes  Patient can perform ADLs without assistance. []  No   [x]   Yes  Anesthesia review: HTN, hx TIA (Left leg & arm weakness), s/p Left nephrectomy for cancer in 2019, GERD, PONV, CPS- long term opiates, some confusion with opiates  Patient was lucid and appropriate with his answers to all my PST questions. His wife was present and did not correct any of his answers.  He has been taking all  hismedications as directed with no lapses.   Patient denies any S&S of respiratory illness or Covid - no shortness of breath, fever, cough or chest pain at PAT appointment.  Patient verbalized understanding and agreement to the Pre-Surgical Instructions that were given to them at this PAT appointment. Patient was also educated of the need to review these PAT instructions again prior to his surgery.I reviewed the appropriate phone numbers to call if they have any and questions or concerns.

## 2024-07-12 ENCOUNTER — Other Ambulatory Visit: Payer: Self-pay

## 2024-07-12 ENCOUNTER — Encounter (HOSPITAL_COMMUNITY): Payer: Self-pay

## 2024-07-12 ENCOUNTER — Encounter (HOSPITAL_COMMUNITY)
Admission: RE | Admit: 2024-07-12 | Discharge: 2024-07-12 | Disposition: A | Discharge status: Home / Self Care | Source: Ambulatory Visit | Attending: Surgery | Admitting: Surgery

## 2024-07-12 VITALS — BP 113/64 | HR 66 | Temp 98.5°F | Resp 14 | Ht 69.0 in | Wt 158.0 lb

## 2024-07-12 DIAGNOSIS — Z01810 Encounter for preprocedural cardiovascular examination: Secondary | ICD-10-CM | POA: Diagnosis present

## 2024-07-12 DIAGNOSIS — Z01818 Encounter for other preprocedural examination: Secondary | ICD-10-CM | POA: Diagnosis not present

## 2024-07-12 DIAGNOSIS — Z01812 Encounter for preprocedural laboratory examination: Secondary | ICD-10-CM | POA: Diagnosis present

## 2024-07-12 DIAGNOSIS — I10 Essential (primary) hypertension: Secondary | ICD-10-CM | POA: Insufficient documentation

## 2024-07-12 DIAGNOSIS — Z79891 Long term (current) use of opiate analgesic: Secondary | ICD-10-CM | POA: Insufficient documentation

## 2024-07-12 DIAGNOSIS — R9431 Abnormal electrocardiogram [ECG] [EKG]: Secondary | ICD-10-CM | POA: Diagnosis not present

## 2024-07-12 HISTORY — DX: Chronic kidney disease, unspecified: N18.9

## 2024-07-12 HISTORY — DX: Other chronic pain: G89.29

## 2024-07-12 HISTORY — DX: Gastro-esophageal reflux disease without esophagitis: K21.9

## 2024-07-12 HISTORY — DX: Cerebral infarction, unspecified: I63.9

## 2024-07-12 HISTORY — DX: Personal history of urinary calculi: Z87.442

## 2024-07-12 HISTORY — DX: Basal cell carcinoma of skin of other parts of face: C44.319

## 2024-07-12 HISTORY — DX: Anxiety disorder, unspecified: F41.9

## 2024-07-12 LAB — CBC
HCT: 33 % — ABNORMAL LOW (ref 39.0–52.0)
Hemoglobin: 10.3 g/dL — ABNORMAL LOW (ref 13.0–17.0)
MCH: 29.2 pg (ref 26.0–34.0)
MCHC: 31.2 g/dL (ref 30.0–36.0)
MCV: 93.5 fL (ref 80.0–100.0)
Platelets: 239 K/uL (ref 150–400)
RBC: 3.53 MIL/uL — ABNORMAL LOW (ref 4.22–5.81)
RDW: 12.9 % (ref 11.5–15.5)
WBC: 5.6 K/uL (ref 4.0–10.5)
nRBC: 0 % (ref 0.0–0.2)

## 2024-07-12 LAB — COMPREHENSIVE METABOLIC PANEL WITH GFR
ALT: 12 U/L (ref 0–44)
AST: 19 U/L (ref 15–41)
Albumin: 4 g/dL (ref 3.5–5.0)
Alkaline Phosphatase: 82 U/L (ref 38–126)
Anion gap: 10 (ref 5–15)
BUN: 24 mg/dL — ABNORMAL HIGH (ref 8–23)
CO2: 25 mmol/L (ref 22–32)
Calcium: 9.4 mg/dL (ref 8.9–10.3)
Chloride: 108 mmol/L (ref 98–111)
Creatinine, Ser: 1.09 mg/dL (ref 0.61–1.24)
GFR, Estimated: >60 mL/min (ref >60)
Glucose, Bld: 84 mg/dL (ref 70–99)
Potassium: 5.5 mmol/L — ABNORMAL HIGH (ref 3.5–5.1)
Sodium: 143 mmol/L (ref 135–145)
Total Bilirubin: 0.7 mg/dL (ref 0.0–1.2)
Total Protein: 6.5 g/dL (ref 6.5–8.1)

## 2024-07-18 ENCOUNTER — Encounter (HOSPITAL_COMMUNITY): Payer: Self-pay | Admitting: Surgery

## 2024-07-18 NOTE — Anesthesia Preprocedure Evaluation (Signed)
 Anesthesia Evaluation  Patient identified by MRN, date of birth, ID band Patient awake    Reviewed: Allergy & Precautions, NPO status , Patient's Chart, lab work & pertinent test results  History of Anesthesia Complications (+) PONV and history of anesthetic complications  Airway Mallampati: II  TM Distance: >3 FB     Dental  (+) Caps, Dental Advisory Given, Partial Upper, Implants   Pulmonary neg pulmonary ROS   breath sounds clear to auscultation + decreased breath sounds      Cardiovascular hypertension, Pt. 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    GI/Hepatic Neg liver ROS,GERD  Medicated,,  Endo/Other  Hx/o thyroid  nodule Gout HLD  Renal/GU Renal diseaseHx/o nephrolithiasis Hx/o left nephrectomy [facility] 2019    BPH    Musculoskeletal  (+) Arthritis , Osteoarthritis,  Left inguinal hernia s   Abdominal   Peds  Hematology  (+) Blood dyscrasia, anemia   Anesthesia Other Findings   Reproductive/Obstetrics                              Anesthesia Physical Anesthesia Plan  ASA: 3  Anesthesia Plan: General   Post-op Pain Management: Ofirmev  IV (intra-op)* and Precedex   Induction: Intravenous and Cricoid pressure planned  PONV Risk Score and Plan: 4 or greater and TIVA, Propofol  infusion, Treatment may vary due to age or medical condition, Ondansetron , Dexamethasone , Diphenhydramine , Aprepitant and Amisulpride  Airway Management Planned: Oral ETT  Additional Equipment: None  Intra-op Plan:   Post-operative Plan: Extubation in OR  Informed Consent: I have reviewed the patients History and Physical, chart, labs and discussed the procedure including the risks, benefits and alternatives for the proposed anesthesia with the patient or authorized representative who  has indicated his/her understanding and acceptance.     Dental advisory given  Plan Discussed with: CRNA and Anesthesiologist  Anesthesia Plan Comments:          Anesthesia Quick Evaluation

## 2024-07-19 ENCOUNTER — Encounter (HOSPITAL_COMMUNITY)
Admission: RE | Disposition: A | Discharge status: Home / Self Care | Payer: Self-pay | Source: Ambulatory Visit | Attending: Surgery

## 2024-07-19 ENCOUNTER — Encounter (HOSPITAL_COMMUNITY): Payer: Self-pay | Admitting: Surgery

## 2024-07-19 ENCOUNTER — Ambulatory Visit (HOSPITAL_COMMUNITY)
Admission: RE | Admit: 2024-07-19 | Discharge: 2024-07-19 | Disposition: A | Discharge status: Home / Self Care | Source: Ambulatory Visit | Attending: Surgery | Admitting: Surgery

## 2024-07-19 ENCOUNTER — Ambulatory Visit (HOSPITAL_BASED_OUTPATIENT_CLINIC_OR_DEPARTMENT_OTHER): Payer: Self-pay | Admitting: Certified Registered Nurse Anesthetist

## 2024-07-19 ENCOUNTER — Ambulatory Visit (HOSPITAL_COMMUNITY): Payer: Self-pay | Admitting: Physician Assistant

## 2024-07-19 DIAGNOSIS — K4090 Unilateral inguinal hernia, without obstruction or gangrene, not specified as recurrent: Secondary | ICD-10-CM | POA: Insufficient documentation

## 2024-07-19 DIAGNOSIS — N189 Chronic kidney disease, unspecified: Secondary | ICD-10-CM | POA: Diagnosis not present

## 2024-07-19 DIAGNOSIS — I129 Hypertensive chronic kidney disease with stage 1 through stage 4 chronic kidney disease, or unspecified chronic kidney disease: Secondary | ICD-10-CM | POA: Insufficient documentation

## 2024-07-19 DIAGNOSIS — K219 Gastro-esophageal reflux disease without esophagitis: Secondary | ICD-10-CM | POA: Insufficient documentation

## 2024-07-19 DIAGNOSIS — K4030 Unilateral inguinal hernia, with obstruction, without gangrene, not specified as recurrent: Secondary | ICD-10-CM | POA: Insufficient documentation

## 2024-07-19 DIAGNOSIS — Z905 Acquired absence of kidney: Secondary | ICD-10-CM | POA: Insufficient documentation

## 2024-07-19 DIAGNOSIS — I639 Cerebral infarction, unspecified: Secondary | ICD-10-CM

## 2024-07-19 DIAGNOSIS — G894 Chronic pain syndrome: Secondary | ICD-10-CM | POA: Insufficient documentation

## 2024-07-19 DIAGNOSIS — F419 Anxiety disorder, unspecified: Secondary | ICD-10-CM

## 2024-07-19 DIAGNOSIS — Z8673 Personal history of transient ischemic attack (TIA), and cerebral infarction without residual deficits: Secondary | ICD-10-CM | POA: Diagnosis not present

## 2024-07-19 DIAGNOSIS — K4020 Bilateral inguinal hernia, without obstruction or gangrene, not specified as recurrent: Secondary | ICD-10-CM

## 2024-07-19 DIAGNOSIS — Z87442 Personal history of urinary calculi: Secondary | ICD-10-CM | POA: Insufficient documentation

## 2024-07-19 DIAGNOSIS — I10 Essential (primary) hypertension: Secondary | ICD-10-CM | POA: Diagnosis not present

## 2024-07-19 DIAGNOSIS — Z79891 Long term (current) use of opiate analgesic: Secondary | ICD-10-CM | POA: Diagnosis not present

## 2024-07-19 DIAGNOSIS — M1990 Unspecified osteoarthritis, unspecified site: Secondary | ICD-10-CM | POA: Diagnosis not present

## 2024-07-19 DIAGNOSIS — Z79899 Other long term (current) drug therapy: Secondary | ICD-10-CM | POA: Diagnosis not present

## 2024-07-19 DIAGNOSIS — E785 Hyperlipidemia, unspecified: Secondary | ICD-10-CM | POA: Insufficient documentation

## 2024-07-19 HISTORY — PX: INGUINAL HERNIA REPAIR: SHX194

## 2024-07-19 SURGERY — REPAIR, HERNIA, INGUINAL, LAPAROSCOPIC
Anesthesia: General | Laterality: Left

## 2024-07-19 MED ORDER — OXYCODONE HCL 5 MG PO TABS: 5.0000 mg | ORAL_TABLET | Freq: Once | ORAL | Status: DC | PRN

## 2024-07-19 MED ORDER — HEPARIN SODIUM (PORCINE) 5000 UNIT/ML IJ SOLN
5000.0000 [IU] | Freq: Once | INTRAMUSCULAR | Status: AC
  Administered 2024-07-19: 5000 [IU] via SUBCUTANEOUS
  Filled 2024-07-19: qty 1

## 2024-07-19 MED ORDER — ONDANSETRON HCL 4 MG/2ML IJ SOLN: 4.0000 mg | Freq: Once | INTRAMUSCULAR | Status: DC | PRN

## 2024-07-19 MED ORDER — LIDOCAINE HCL (PF) 2 % IJ SOLN
INTRAMUSCULAR | Status: AC
  Filled 2024-07-19: qty 5

## 2024-07-19 MED ORDER — 0.9 % SODIUM CHLORIDE (POUR BTL) OPTIME
TOPICAL | Status: DC | PRN
  Administered 2024-07-19: 1000 mL

## 2024-07-19 MED ORDER — CHLORHEXIDINE GLUCONATE CLOTH 2 % EX PADS: 6.0000 | MEDICATED_PAD | Freq: Once | CUTANEOUS | Status: DC

## 2024-07-19 MED ORDER — ONDANSETRON HCL 4 MG/2ML IJ SOLN
INTRAMUSCULAR | Status: AC
  Filled 2024-07-19: qty 2

## 2024-07-19 MED ORDER — ONDANSETRON HCL 4 MG/2ML IJ SOLN
INTRAMUSCULAR | Status: DC | PRN
  Administered 2024-07-19: 4 mg via INTRAVENOUS

## 2024-07-19 MED ORDER — SUGAMMADEX SODIUM 200 MG/2ML IV SOLN
INTRAVENOUS | Status: AC
  Filled 2024-07-19: qty 2

## 2024-07-19 MED ORDER — FENTANYL CITRATE (PF) 100 MCG/2ML IJ SOLN
INTRAMUSCULAR | Status: DC | PRN
  Administered 2024-07-19 (×3): 50 ug via INTRAVENOUS

## 2024-07-19 MED ORDER — PROPOFOL 10 MG/ML IV BOLUS
INTRAVENOUS | Status: AC
  Filled 2024-07-19: qty 20

## 2024-07-19 MED ORDER — SUGAMMADEX SODIUM 200 MG/2ML IV SOLN
INTRAVENOUS | Status: DC | PRN
  Administered 2024-07-19: 200 mg via INTRAVENOUS

## 2024-07-19 MED ORDER — HYDROMORPHONE HCL 1 MG/ML IJ SOLN
INTRAMUSCULAR | Status: AC
  Filled 2024-07-19: qty 1

## 2024-07-19 MED ORDER — ACETAMINOPHEN 10 MG/ML IV SOLN
INTRAVENOUS | Status: DC | PRN
Start: 2024-07-19 — End: 2024-07-19
  Administered 2024-07-19: 1000 mg via INTRAVENOUS

## 2024-07-19 MED ORDER — HYDROMORPHONE HCL 1 MG/ML IJ SOLN
0.2500 mg | INTRAMUSCULAR | Status: DC | PRN
  Administered 2024-07-19 (×2): 0.25 mg via INTRAVENOUS

## 2024-07-19 MED ORDER — ROCURONIUM BROMIDE 10 MG/ML (PF) SYRINGE
PREFILLED_SYRINGE | INTRAVENOUS | Status: DC | PRN
  Administered 2024-07-19: 20 mg via INTRAVENOUS
  Administered 2024-07-19: 60 mg via INTRAVENOUS
  Administered 2024-07-19: 20 mg via INTRAVENOUS

## 2024-07-19 MED ORDER — AMISULPRIDE (ANTIEMETIC) 5 MG/2ML IV SOLN
10.0000 mg | Freq: Once | INTRAVENOUS | Status: AC | PRN
  Administered 2024-07-19: 10 mg via INTRAVENOUS

## 2024-07-19 MED ORDER — DIPHENHYDRAMINE HCL 50 MG/ML IJ SOLN
12.5000 mg | Freq: Once | INTRAMUSCULAR | Status: AC
  Administered 2024-07-19: 12.5 mg via INTRAVENOUS
  Filled 2024-07-19: qty 1

## 2024-07-19 MED ORDER — CHLORHEXIDINE GLUCONATE 0.12 % MT SOLN
15.0000 mL | Freq: Once | OROMUCOSAL | Status: AC
  Administered 2024-07-19: 15 mL via OROMUCOSAL

## 2024-07-19 MED ORDER — BUPIVACAINE-EPINEPHRINE (PF) 0.25% -1:200000 IJ SOLN
INTRAMUSCULAR | Status: DC | PRN
  Administered 2024-07-19: 30 mL

## 2024-07-19 MED ORDER — PROPOFOL 500 MG/50ML IV EMUL
INTRAVENOUS | Status: DC | PRN
  Administered 2024-07-19: 30 mg via INTRAVENOUS
  Administered 2024-07-19: 110 mg via INTRAVENOUS
  Administered 2024-07-19: 150 ug/kg/min via INTRAVENOUS

## 2024-07-19 MED ORDER — ROCURONIUM BROMIDE 10 MG/ML (PF) SYRINGE
PREFILLED_SYRINGE | INTRAVENOUS | Status: AC
  Filled 2024-07-19: qty 10

## 2024-07-19 MED ORDER — PHENYLEPHRINE HCL-NACL 20-0.9 MG/250ML-% IV SOLN
INTRAVENOUS | Status: DC | PRN
  Administered 2024-07-19: 20 ug/min via INTRAVENOUS

## 2024-07-19 MED ORDER — OXYCODONE HCL 5 MG/5ML PO SOLN: 5.0000 mg | Freq: Once | ORAL | Status: DC | PRN

## 2024-07-19 MED ORDER — PROPOFOL 1000 MG/100ML IV EMUL
INTRAVENOUS | Status: AC
  Filled 2024-07-19: qty 100

## 2024-07-19 MED ORDER — AMISULPRIDE (ANTIEMETIC) 5 MG/2ML IV SOLN
INTRAVENOUS | Status: AC
  Filled 2024-07-19: qty 4

## 2024-07-19 MED ORDER — BUPIVACAINE-EPINEPHRINE (PF) 0.25% -1:200000 IJ SOLN
INTRAMUSCULAR | Status: AC
  Filled 2024-07-19: qty 30

## 2024-07-19 MED ORDER — HYDROCODONE-ACETAMINOPHEN 5-325 MG PO TABS: 1.0000 | ORAL_TABLET | ORAL | 0 refills | Status: DC | PRN

## 2024-07-19 MED ORDER — CEFAZOLIN SODIUM-DEXTROSE 2-4 GM/100ML-% IV SOLN
2.0000 g | INTRAVENOUS | Status: AC
  Administered 2024-07-19: 2 g via INTRAVENOUS
  Filled 2024-07-19: qty 100

## 2024-07-19 MED ORDER — ACETAMINOPHEN 10 MG/ML IV SOLN
INTRAVENOUS | Status: AC
  Filled 2024-07-19: qty 100

## 2024-07-19 MED ORDER — FENTANYL CITRATE (PF) 100 MCG/2ML IJ SOLN
INTRAMUSCULAR | Status: AC
  Filled 2024-07-19: qty 2

## 2024-07-19 MED ORDER — DEXAMETHASONE SOD PHOSPHATE PF 10 MG/ML IJ SOLN
INTRAMUSCULAR | Status: DC | PRN
  Administered 2024-07-19: 5 mg via INTRAVENOUS

## 2024-07-19 MED ORDER — LIDOCAINE HCL (PF) 2 % IJ SOLN
INTRAMUSCULAR | Status: DC | PRN
  Administered 2024-07-19: 60 mg via INTRADERMAL

## 2024-07-19 MED ORDER — ACETAMINOPHEN 500 MG PO TABS
1000.0000 mg | ORAL_TABLET | ORAL | Status: DC
  Filled 2024-07-19: qty 2

## 2024-07-19 MED ORDER — LACTATED RINGERS IV SOLN: INTRAVENOUS | Status: DC

## 2024-07-19 MED ORDER — APREPITANT 40 MG PO CAPS: 40.0000 mg | ORAL_CAPSULE | Freq: Once | ORAL | Status: DC

## 2024-07-19 MED ORDER — ORAL CARE MOUTH RINSE: 15.0000 mL | Freq: Once | OROMUCOSAL | Status: AC

## 2024-07-19 SURGICAL SUPPLY — 32 items
BAG COUNTER SPONGE SURGICOUNT (BAG) ×1 IMPLANT
CABLE HIGH FREQUENCY MONO STRZ (ELECTRODE) ×1 IMPLANT
CHLORAPREP W/TINT 26 (MISCELLANEOUS) ×1 IMPLANT
COVER SURGICAL LIGHT HANDLE (MISCELLANEOUS) ×1 IMPLANT
DERMABOND ADVANCED .7 DNX12 (GAUZE/BANDAGES/DRESSINGS) ×1 IMPLANT
ELECT REM PT RETURN 15FT ADLT (MISCELLANEOUS) ×1 IMPLANT
GLOVE BIO SURGEON STRL SZ7.5 (GLOVE) ×1 IMPLANT
GLOVE INDICATOR 8.0 STRL GRN (GLOVE) ×1 IMPLANT
GOWN STRL REUS W/ TWL XL LVL3 (GOWN DISPOSABLE) ×1 IMPLANT
GRASPER SUT TROCAR 14GX15 (MISCELLANEOUS) ×1 IMPLANT
IRRIGATION SUCT STRKRFLW 2 WTP (MISCELLANEOUS) IMPLANT
KIT BASIN OR (CUSTOM PROCEDURE TRAY) ×1 IMPLANT
KIT TURNOVER KIT A (KITS) ×1 IMPLANT
MARKER SKIN DUAL TIP RULER LAB (MISCELLANEOUS) ×1 IMPLANT
MESH 3DMAX 5X7 LT XLRG (Mesh General) IMPLANT
MESH 3DMAX 5X7 RT XLRG (Mesh General) IMPLANT
NDL INSUFFLATION 14GA 120MM (NEEDLE) ×1 IMPLANT
NEEDLE INSUFFLATION 14GA 120MM (NEEDLE) ×1 IMPLANT
RELOAD STAPLE 4.0 BLU F/HERNIA (INSTRUMENTS) ×1 IMPLANT
RELOAD STAPLE 4.8 BLK F/HERNIA (STAPLE) IMPLANT
SCISSORS LAP 5X35 DISP (ENDOMECHANICALS) ×1 IMPLANT
SET TUBE SMOKE EVAC HIGH FLOW (TUBING) ×1 IMPLANT
SLEEVE Z-THREAD 5X100MM (TROCAR) ×1 IMPLANT
SPIKE FLUID TRANSFER (MISCELLANEOUS) IMPLANT
STAPLER HERNIA 12 8.5 360D (INSTRUMENTS) ×1 IMPLANT
SUT MNCRL AB 4-0 PS2 18 (SUTURE) ×1 IMPLANT
SUT VICRYL 0 UR6 27IN ABS (SUTURE) ×1 IMPLANT
TOWEL OR 17X26 10 PK STRL BLUE (TOWEL DISPOSABLE) ×1 IMPLANT
TRAY FOL W/BAG SLVR 16FR STRL (SET/KITS/TRAYS/PACK) IMPLANT
TRAY LAPAROSCOPIC (CUSTOM PROCEDURE TRAY) ×1 IMPLANT
TROCAR ADV FIXATION 12X100MM (TROCAR) ×1 IMPLANT
TROCAR Z-THREAD FIOS 5X100MM (TROCAR) ×1 IMPLANT

## 2024-07-19 NOTE — Anesthesia Postprocedure Evaluation (Signed)
 Anesthesia Post Note  Patient: Bernard Young  Procedure(s) Performed: REPAIR WITH MESH, HERNIA, INGUINAL, LAPAROSCOPIC BILATERAL (Left)     Patient location during evaluation: PACU Anesthesia Type: General Level of consciousness: awake and alert and oriented Pain management: pain level controlled Vital Signs Assessment: post-procedure vital signs reviewed and stable Respiratory status: spontaneous breathing, nonlabored ventilation and respiratory function stable Cardiovascular status: blood pressure returned to baseline and stable Anesthetic complications: no   No notable events documented.  Last Vitals:  Vitals:   07/19/24 0946 07/19/24 1000  BP:    Pulse:    Resp:    Temp:    SpO2: (P) 93% 97%    Last Pain:  Vitals:   07/19/24 0954  TempSrc:   PainSc: (P) 5                  Axten Pascucci A.

## 2024-07-19 NOTE — Transfer of Care (Signed)
 Immediate Anesthesia Transfer of Care Note  Patient: Bernard Young  Procedure(s) Performed: REPAIR WITH MESH, HERNIA, INGUINAL, LAPAROSCOPIC BILATERAL (Left)  Patient Location: PACU  Anesthesia Type:General  Level of Consciousness: drowsy and responds to stimulation  Airway & Oxygen Therapy: Patient Spontanous Breathing and Patient connected to face mask oxygen  Post-op Assessment: Report given to RN and Post -op Vital signs reviewed and stable  Post vital signs: Reviewed and stable  Last Vitals:  Vitals Value Taken Time  BP 146/71 07/19/24 09:02  Temp    Pulse 68 07/19/24 09:08  Resp 16 07/19/24 09:08  SpO2 100 % 07/19/24 09:08  Vitals shown include unfiled device data.  Last Pain:  Vitals:   07/19/24 0644  TempSrc:   PainSc: 0-No pain         Complications: No notable events documented.

## 2024-07-19 NOTE — Anesthesia Procedure Notes (Signed)
 Procedure Name: Intubation Date/Time: 07/19/2024 7:36 AM  Performed by: Franchot Delon RAMAN, CRNAPre-anesthesia Checklist: Patient identified, Emergency Drugs available, Suction available and Patient being monitored Patient Re-evaluated:Patient Re-evaluated prior to induction Oxygen Delivery Method: Circle System Utilized Preoxygenation: Pre-oxygenation with 100% oxygen Induction Type: IV induction Ventilation: Mask ventilation without difficulty Laryngoscope Size: Mac and 4 Grade View: Grade I Tube type: Oral Tube size: 7.5 mm Number of attempts: 1 Airway Equipment and Method: Stylet Placement Confirmation: ETT inserted through vocal cords under direct vision, positive ETCO2 and breath sounds checked- equal and bilateral Secured at: 22 cm Tube secured with: Tape Dental Injury: Teeth and Oropharynx as per pre-operative assessment

## 2024-07-19 NOTE — Discharge Instructions (Signed)

## 2024-07-19 NOTE — H&P (Signed)
 Admitting Physician: Deward PARAS Ricard Faulkner  Service: General surgery  CC: hernia  Subjective   HPI: Bernard Young is an 83 y.o. male who is here for hernia repair  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   Chronic kidney disease    Chronic pain    long term opiates   Diverticulosis of colon 05/2017   noted on CT abd/pelvis   Enlarged prostate 06/26/2017   mild , noted on CT abd/pelvis   GERD (gastroesophageal reflux disease)    Headache    History of kidney stones    Hypertension    Inguinal hernia 05/2017   small to moderate right containing bowel loop of sigmoid colon, noted on CT abd/pelvis   Numbness and tingling in left hand    PONV (postoperative nausea and vomiting)    very very bad nausea but no vomiting : i get so sick of it and it is just awful ; even with a full stomach i cant vomit     Renal mass, left    Shoulder pain    right    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 Jane   left knee arthoscopy   2001   ROBOT ASSISTED LAPAROSCOPIC NEPHRECTOMY Left 10/29/2017   Procedure: XI ROBOTIC ASSISTED LAPAROSCOPIC / PARTIAL NEPHRECTOMY;  Surgeon: Renda Glance, MD;  Location: [facility];  Service: Urology;  Laterality: Left;  NEEDS 210 MIN   TONSILLECTOMY      History reviewed. No pertinent family history.  Social:  reports that he has never smoked. He has never used smokeless tobacco. He reports that he does not drink alcohol and does not use drugs.  Allergies:  Allergies  Allergen Reactions   Lactose Intolerance (Gi) Diarrhea   Sulfa  Antibiotics Other (See Comments)    Unknown childhood reaction   Penicillins Rash    Has patient had a PCN reaction causing immediate rash, facial/tongue/throat swelling, SOB or lightheadedness with hypotension: No Has patient had a PCN reaction causing severe rash involving mucus membranes or skin necrosis: No Has patient had a PCN reaction that required hospitalization: No Has patient had a PCN reaction occurring within the last 10 years: No If all of the above answers are NO, then may proceed with Cephalosporin use.    Medications: Current Outpatient Medications  Medication Instructions   atorvastatin  (LIPITOR) 40 mg, Oral, Daily   clotrimazole (LOTRIMIN) 1 % cream 1 Application, Topical, Daily PRN   famotidine (PEPCID) 20 mg, Oral, Daily PRN   HYDROcodone -acetaminophen  (NORCO) 5-325 MG tablet 1-2 tablets, Oral, Every 6 hours PRN   lactase (LACTAID) 3,000 Units, Daily   losartan  (COZAAR ) 50 mg, Daily   Multiple Vitamins-Minerals (MULTIVITAMIN WITH MINERALS) tablet 1 tablet, Oral, Daily, Centrum +   omeprazole (PRILOSEC) 20 mg, Daily   ondansetron  (ZOFRAN ) 4 mg, Oral, Every 6 hours PRN   polyethylene glycol powder (GLYCOLAX/MIRALAX) 17 g, Oral, Daily, Dissolve 1 capful (  17g) in 4-8 ounces of liquid and take by mouth daily.   triamcinolone  cream (KENALOG ) 0.1 % 1 application , Daily PRN    ROS - all of the below systems have been reviewed with the patient and positives are indicated with bold text General: chills, fever or night sweats Eyes: blurry vision or double vision ENT: epistaxis or sore throat Allergy/Immunology: itchy/watery eyes or nasal congestion Hematologic/Lymphatic: bleeding problems, blood clots or swollen lymph nodes Endocrine: temperature intolerance or unexpected weight changes Breast: new or changing breast lumps or nipple discharge Resp: cough, shortness of breath, or wheezing CV: chest pain or dyspnea on exertion GI: as per HPI GU: dysuria, trouble  voiding, or hematuria MSK: joint pain or joint stiffness Neuro: TIA or stroke symptoms Derm: pruritus and skin lesion changes Psych: anxiety and depression  Objective   PE Blood pressure (!) 146/65, pulse 64, temperature 98.8 F (37.1 C), temperature source Oral, resp. rate 16, SpO2 98%. Constitutional: NAD; conversant; no deformities Eyes: Moist conjunctiva; no lid lag; anicteric; PERRL Neck: Trachea midline; no thyromegaly Lungs: Normal respiratory effort; no tactile fremitus CV: RRR; no palpable thrills; no pitting edema GI: Abd left inguinal hernai; no palpable hepatosplenomegaly MSK: Normal range of motion of extremities; no clubbing/cyanosis Psychiatric: Appropriate affect; alert and oriented x3 Lymphatic: No palpable cervical or axillary lymphadenopathy  No results found for this or any previous visit (from the past 24 hours).  Imaging Orders  No imaging studies ordered today     Assessment and Plan   Bernard Young is an 83 y.o. male with a left inguinal hernia.  I recommended laparoscopic left inguinal hernia repair with mesh.  We discussed the procedure, its risks, benefits and alteratives and the patient granted consent to proceed.  Deward JINNY Foy, MD  [facility] Use [email or web address] to contact on call provider

## 2024-07-19 NOTE — Op Note (Addendum)
 Patient: Bernard Young (1941-08-29, [id number])  Date of Surgery: 07/19/2024  Preoperative Diagnosis: LEFT INGUINAL HERNIA   Postoperative Diagnosis: BILATERAL INGUINAL HERNIA   Surgical Procedure: Laparoscopic bilateral inguinal hernia repair with mesh   Operative Team Members:  Surgeons and Role:    * Tykee Heideman, Deward PARAS, MD - Primary   Anesthesiologist: Jerrye Sharper, MD CRNA: Franchot Delon RAMAN, CRNA; Rudolpho Fonda SAUNDERS, CRNA   Anesthesia: General   Fluids:  Total I/O In: 1000 [I.V.:800; IV Piggyback:200] Out: 130 [Urine:125; Blood:5]  Complications: None  Drains:  None  Specimen: None  Disposition:  PACU - hemodynamically stable.  Plan of Care: Discharge to home after PACU  Indications for Procedure: Bernard Young is a 83 y.o. male who presented with a symptomatic left inguinal hernia.  I recommended laparoscopic left inguinal hernia repair with mesh.  We discussed the procedure, its risks, benefits and alternatives and the patient granted consent to proceed.  Findings:  Technique: Transabdominal preperitoneal (TAPP) Hernia Location: Left indirect, right direct inguinal hernias Mesh Size &Type:  Bard 3D max extra-large right and left sided meshes Mesh Fixation: Endo-Universal hernia stapler  Infection status: Patient: Private Patient Elective Case Case: Elective Infection Present At Time Of Surgery (PATOS): None   Description of Procedure:  The patient was positioned supine, padded and secured to the bed, with both arms tucked.  The abdomen was widely prepped and draped.  A time out procedure was performed.  A 1 cm infraumbilical incision was made.  The abdomen was entered without trauma to the underlying viscera.  The abdomen was insufflated to 15 mm of Hg.  A 12 mm trocar was inserted at the periumbilical incision.  Additional 5 mm trocars were placed in the left and right abdomen.  There was no trauma to the underlying viscera.  There was an direct  hernia on the RIGHT.  Utilizing a transabdominal pre peritoneal technique (TAPP), a horizontal incision was made in the peritoneum, immediately below the umbilicus.  Dissection was carried out in the pre peritoneal space down to the level of the hernia sac which was reduced into the peritoneal cavity completely.  The cord contents were parietalized and preserved.  A large pre peritoneal dissection was performed to uncover the direct, indirect, femoral and obturator spaces.  Cooper's ligament was uncovered medially and the psoas muscle uncovered laterally.  The mesh, as documented above, was opened and advanced into the pre peritoneal position so that it more than adequately covered the indirect, direct, femoral and obturator spaces.  The mesh laid flat, with no inferior folds and covered the entire myopectineal orifice.  The mesh was fixated with the endo-universal hernia stapler to Cooper's ligament and the posterior aspect of the rectus muscle.  The peritoneal flap was closed with the same device.  There were no peritoneal defects or exposed mesh at the conclusion.  There was an indirect hernia on the LEFT containing incarcerated sigmoid colon.  Utilizing a transabdominal pre peritoneal technique (TAPP), a horizontal incision was made in the peritoneum, immediately below the umbilicus.  Dissection was carried out in the pre peritoneal space down to the level of the hernia sac which was reduced into the peritoneal cavity completely.  The cord contents were parietalized and preserved.  A large pre peritoneal dissection was performed to uncover the direct, indirect, femoral and obturator spaces.  Cooper's ligament was uncovered medially and the psoas muscle uncovered laterally.  The mesh, as documented above, was opened and advanced into the pre peritoneal position  so that it more than adequately covered the indirect, direct, femoral and obturator spaces.  The mesh laid flat, with no inferior folds and covered  the entire myopectineal orifice.  The mesh was fixated with the endo-universal hernia stapler to Cooper's ligament and the posterior aspect of the rectus muscle.  The peritoneal flap was closed with the same device.  There were no peritoneal defects or exposed mesh at the conclusion.  The umbilical trocar was removed and the fascial defect was closed with a 0 Vicryl suture.  The peritoneal cavity was completely desufflated, the trocars removed and the skin closed with 4-0 Monocryl subcuticular suture and skin glue.  All sponge and needle counts were correct at the end of the case.  Deward Foy, MD General, Bariatric, & Minimally Invasive Surgery [facility], [place]

## 2024-07-20 ENCOUNTER — Encounter (HOSPITAL_COMMUNITY): Payer: Self-pay | Admitting: Surgery

## 2024-08-10 ENCOUNTER — Emergency Department (HOSPITAL_COMMUNITY)

## 2024-08-10 ENCOUNTER — Encounter (HOSPITAL_COMMUNITY): Payer: Self-pay

## 2024-08-10 ENCOUNTER — Other Ambulatory Visit: Payer: Self-pay

## 2024-08-10 ENCOUNTER — Emergency Department (HOSPITAL_COMMUNITY)
Admission: EM | Admit: 2024-08-10 | Discharge: 2024-08-11 | Disposition: A | Discharge status: Home / Self Care | Attending: Emergency Medicine | Admitting: Emergency Medicine

## 2024-08-10 DIAGNOSIS — Z79899 Other long term (current) drug therapy: Secondary | ICD-10-CM | POA: Diagnosis not present

## 2024-08-10 DIAGNOSIS — I959 Hypotension, unspecified: Secondary | ICD-10-CM | POA: Diagnosis not present

## 2024-08-10 DIAGNOSIS — I129 Hypertensive chronic kidney disease with stage 1 through stage 4 chronic kidney disease, or unspecified chronic kidney disease: Secondary | ICD-10-CM | POA: Diagnosis not present

## 2024-08-10 DIAGNOSIS — N189 Chronic kidney disease, unspecified: Secondary | ICD-10-CM | POA: Insufficient documentation

## 2024-08-10 DIAGNOSIS — I951 Orthostatic hypotension: Secondary | ICD-10-CM | POA: Diagnosis not present

## 2024-08-10 DIAGNOSIS — R55 Syncope and collapse: Secondary | ICD-10-CM

## 2024-08-10 DIAGNOSIS — Z8673 Personal history of transient ischemic attack (TIA), and cerebral infarction without residual deficits: Secondary | ICD-10-CM | POA: Insufficient documentation

## 2024-08-10 DIAGNOSIS — I700 Atherosclerosis of aorta: Secondary | ICD-10-CM | POA: Diagnosis not present

## 2024-08-10 LAB — CBC WITH DIFFERENTIAL/PLATELET
Abs Immature Granulocytes: 0.02 K/uL (ref 0.00–0.07)
Basophils Absolute: 0.1 K/uL (ref 0.0–0.1)
Basophils Relative: 1 %
Eosinophils Absolute: 0.3 K/uL (ref 0.0–0.5)
Eosinophils Relative: 4 %
HCT: 29 % — ABNORMAL LOW (ref 39.0–52.0)
Hemoglobin: 9.7 g/dL — ABNORMAL LOW (ref 13.0–17.0)
Immature Granulocytes: 0 %
Lymphocytes Relative: 14 %
Lymphs Abs: 1 K/uL (ref 0.7–4.0)
MCH: 31 pg (ref 26.0–34.0)
MCHC: 33.4 g/dL (ref 30.0–36.0)
MCV: 92.7 fL (ref 80.0–100.0)
Monocytes Absolute: 0.5 K/uL (ref 0.1–1.0)
Monocytes Relative: 7 %
Neutro Abs: 5.2 K/uL (ref 1.7–7.7)
Neutrophils Relative %: 74 %
Platelets: 208 K/uL (ref 150–400)
RBC: 3.13 MIL/uL — ABNORMAL LOW (ref 4.22–5.81)
RDW: 12.7 % (ref 11.5–15.5)
WBC: 7 K/uL (ref 4.0–10.5)
nRBC: 0 % (ref 0.0–0.2)

## 2024-08-10 LAB — COMPREHENSIVE METABOLIC PANEL WITH GFR
ALT: 11 U/L (ref 0–44)
AST: 21 U/L (ref 15–41)
Albumin: 3.6 g/dL (ref 3.5–5.0)
Alkaline Phosphatase: 86 U/L (ref 38–126)
Anion gap: 9 (ref 5–15)
BUN: 21 mg/dL (ref 8–23)
CO2: 26 mmol/L (ref 22–32)
Calcium: 8.9 mg/dL (ref 8.9–10.3)
Chloride: 108 mmol/L (ref 98–111)
Creatinine, Ser: 1.13 mg/dL (ref 0.61–1.24)
GFR, Estimated: >60 mL/min (ref >60)
Glucose, Bld: 98 mg/dL (ref 70–99)
Potassium: 4.3 mmol/L (ref 3.5–5.1)
Sodium: 143 mmol/L (ref 135–145)
Total Bilirubin: 0.5 mg/dL (ref 0.0–1.2)
Total Protein: 6.2 g/dL — ABNORMAL LOW (ref 6.5–8.1)

## 2024-08-10 LAB — SARS CORONAVIRUS 2 BY RT PCR: SARS Coronavirus 2 by RT PCR: NEGATIVE

## 2024-08-10 NOTE — ED Provider Notes (Signed)
 [place] EMERGENCY DEPARTMENT AT [facility] Provider Note   CSN: [id number] Arrival date & time: 08/10/24  1910     Patient presents with: Hypotension and Dizziness   Bernard Young is a 83 y.o. male.   Patient is an 83 year old male with a history of prior TIA, hypertension, CKD, chronic pain and recent hernia surgery several weeks ago who is presenting today with an episode of near syncope.  Patient reports that he was walking out to the mailbox to get his mail and then on the way back he started to feel very dizzy.  He was able to make it to the steps and handed the mail to his wife and was able to get up the steps but then reported that the dizziness started to get even worse and he did not think he could go any further and his wife helped him sit down.  He reports after sitting down he started to feel nauseated and sweaty.  They called 911.  Patient reports when EMS got there they tried to get him up to the stretcher and he could not stand or walk because he was feeling so dizzy.  He denies ever having any unilateral numbness or weakness.  He denied any speech problems.  No visual changes.  He did report when EMS arrived when they checked his blood pressure while he was sitting he was okay but they checked it while he stood up and his blood pressure dropped and also made him more symptomatic.  He was given fluid and route and reports that now his dizziness is gone.  He had no chest pain or palpitations during this.  He has had ongoing swelling in his legs for a while but does not feel that it is any different.  He has severe hip pain that he is waiting to get a hip replacement but had to get his hernia repaired first prior to that.  He has been doing well after the hernia repair.  He continues to take Vicodin for the pain and has been having bowel movements regularly.  He reports he never drinks enough fluid but feels that his appetite has been the same.  He has not had any fevers  or chills recently.  He denies any cough or congestion.  Also no shortness of breath.  He denies any history of PE or DVT and no known cardiac history.  The history is provided by the patient and medical records.  Dizziness      Prior to Admission medications   Medication Sig Start Date End Date Taking? Authorizing Provider  atorvastatin  (LIPITOR) 40 MG tablet Take 1 tablet (40 mg total) by mouth daily. 09/08/21   Krishnan, Gokul, MD  clotrimazole (LOTRIMIN) 1 % cream Apply 1 Application topically daily as needed (Itching).    [provider]  famotidine (PEPCID) 20 MG tablet Take 20 mg by mouth daily as needed (Stomach issue).    [provider]  HYDROcodone -acetaminophen  (NORCO) 5-325 MG tablet Take 1-2 tablets by mouth every 6 (six) hours as needed for moderate pain or severe pain. Patient taking differently: Take 0.5 tablets by mouth 2 (two) times daily. 10/29/17   Cory Palma, PA-C  HYDROcodone -acetaminophen  (NORCO/VICODIN) 5-325 MG tablet Take 1 tablet by mouth every 4 (four) hours as needed for severe pain (pain score 7-10). 07/19/24 07/19/25  Stechschulte, Deward PARAS, MD  lactase (LACTAID) 3000 units tablet Take 3,000 Units by mouth daily.    [provider]  losartan  (COZAAR )  50 MG tablet Take 50 mg by mouth daily. 09/03/17   [provider]  Multiple Vitamins-Minerals (MULTIVITAMIN WITH MINERALS) tablet Take 1 tablet by mouth daily. Centrum +    [provider]  omeprazole (PRILOSEC) 20 MG capsule Take 20 mg by mouth daily.    [provider]  ondansetron  (ZOFRAN ) 4 MG tablet Take 1 tablet (4 mg total) by mouth every 6 (six) hours as needed for nausea or vomiting. 10/31/17   Watt Rush, MD  polyethylene glycol powder (GLYCOLAX/MIRALAX) 17 GM/SCOOP powder Take 17 g by mouth daily. Dissolve 1 capful (17g) in 4-8 ounces of liquid and take by mouth daily.    [provider]  triamcinolone  cream (KENALOG ) 0.1 % Apply 1 application  topically daily as needed (for psoriasis rash).  05/12/17   [provider]    Allergies: Lactose intolerance (gi), Sulfa antibiotics, and Penicillins    Review of Systems  Neurological:  Positive for dizziness.    Updated Vital Signs BP 129/73   Pulse 82   Temp 98.3 F (36.8 C) (Oral)   Resp (!) 23   SpO2 100%   Physical Exam Vitals and nursing note reviewed.  Constitutional:      General: He is not in acute distress.    Appearance: He is well-developed.  HENT:     Head: Normocephalic and atraumatic.  Eyes:     General: No visual field deficit.    Conjunctiva/sclera: Conjunctivae normal.     Pupils: Pupils are equal, round, and reactive to light.  Cardiovascular:     Rate and Rhythm: Normal rate and regular rhythm.     Heart sounds: No murmur heard. Pulmonary:     Effort: Pulmonary effort is normal. No respiratory distress.     Breath sounds: Normal breath sounds. No wheezing or rales.  Abdominal:     General: There is no distension.     Palpations: Abdomen is soft.     Tenderness: There is no abdominal tenderness. There is no guarding or rebound.     Comments: Healing surgical scars over the abdomen no specific abdominal tenderness  Musculoskeletal:        General: No tenderness. Normal range of motion.     Cervical back: Normal range of motion and neck supple.     Right lower leg: Edema present.     Left lower leg: Edema present.     Comments: 1+ pitting edema bilateral lower extremities  Skin:    General: Skin is warm and dry.     Findings: No erythema or rash.  Neurological:     Mental Status: He is alert and oriented to person, place, and time.     Cranial Nerves: No dysarthria or facial asymmetry.     Sensory: Sensation is intact.     Coordination: Coordination is intact.     Comments: 5/5 strength in bilateral upper extremities and the left lower extremity.  Patient reports he cannot lift his right lower extremity because his hip hurts too badly.   He can flex and extend his foot.  Psychiatric:        Behavior: Behavior normal.     (all labs ordered are listed, but only abnormal results are displayed) Labs Reviewed  CBC WITH DIFFERENTIAL/PLATELET - Abnormal; Notable for the following components:      Result Value   RBC 3.13 (*)    Hemoglobin 9.7 (*)    HCT 29.0 (*)    All other components within normal limits  COMPREHENSIVE METABOLIC PANEL WITH GFR - Abnormal; Notable for the following components:   Total Protein 6.2 (*)    All other components within normal limits  SARS CORONAVIRUS 2 BY RT PCR    EKG: EKG Interpretation Date/Time:  Wednesday August 10 2024 20:31:06 EST Ventricular Rate:  83 PR Interval:  252 QRS Duration:  90 QT Interval:  409 QTC Calculation: 481 R Axis:   72  Text Interpretation: Sinus rhythm Prolonged PR interval Borderline prolonged QT interval No significant change since last tracing Confirmed by Doretha Folks ([id number]) on 08/10/2024 8:59:50 PM  Radiology: [facility] Chest Port 1 View Result Date: 08/10/2024 CLINICAL DATA:  Recent near syncopal episode EXAM: PORTABLE CHEST 1 VIEW COMPARISON:  08/15/2027 FINDINGS: Cardiac shadow is stable. Calcified granuloma is again noted in the left lung base. Calcified mediastinal nodes are seen. No focal infiltrate is noted. Aortic calcifications are noted. IMPRESSION: Changes of prior granulomatous disease.  No acute abnormality noted. Electronically Signed   By: Oneil Devonshire M.D.   On: 08/10/2024 21:08     Procedures   Medications Ordered in the ED - No data to display                                  Medical Decision Making Amount and/or Complexity of Data Reviewed Independent Historian: EMS External Data Reviewed: notes. Labs: ordered. Decision-making details documented in ED Course. Radiology: ordered and independent interpretation performed. Decision-making details documented in ED Course. ECG/medicine tests: ordered and independent interpretation  performed. Decision-making details documented in ED Course.   Pt with multiple medical problems and comorbidities and presenting today with a complaint that caries a high risk for morbidity and mortality.  Here today with symptoms most classic for near syncope.  Patient's symptoms are less likely TIA or stroke.  Symptoms resolved after getting IV fluids.  Concern for possible dehydration, electrolyte abnormality, anemia given his recent surgery.  Lower suspicion for dysrhythmia or PE as patient is not tachycardic does not take beta-blocker therapy and is not having any chest pain or shortness of breath.  Also low suspicion for ACS.  I independently interpreted patient's EKG which shows a normal sinus rhythm patient does have a prolonged PR interval today which is new from his prior but no findings consistent with Brugada's or WPW.  Patient's blood pressure has been normal here but did receive fluid by EMS and reports feeling much better. I independently interpreted patient's labs and CBC shows stable hemoglobin of 9.7 normal white count, CMP within normal limits, COVID is negative,  I have independently visualized and interpreted pt's images today.  Chest x-ray within normal limits. On re-eval pt is still feeling well.  Will make sure pt is able to ambulate without feeling lightheaded or dizzy.  Patient was able to ambulate here without any difficulty.  He otherwise is well-appearing.  No focal deficits noted.  At this time feel patient is stable for discharge home.  No indication for admission.     Final diagnoses:  Near syncope  Orthostatic hypotension    ED Discharge Orders     None          Doretha Folks, MD 08/10/24 2327

## 2024-08-10 NOTE — Discharge Instructions (Addendum)
 Return to the emergency room if you develop any palpitations, shortness of breath or chest pain.  If you have any episode of completely passing out.  Make sure you are drinking plenty of fluid.

## 2024-08-10 NOTE — ED Triage Notes (Signed)
 PT BIB [facility]. Per EMS PT went on a 3/4 mile walk came home then went back out to the mailbox. When he came  back in he became dizzy and called 911. EMS stated his orthostatic vitals went from 130 systolic laying down to 100 systolic standing. EMS gave fluids on the way in. PT color has started to improve and last EMS bp was 146/71, HR 76, CBG 94

## 2024-08-17 ENCOUNTER — Other Ambulatory Visit: Payer: Self-pay

## 2024-08-17 ENCOUNTER — Emergency Department (HOSPITAL_BASED_OUTPATIENT_CLINIC_OR_DEPARTMENT_OTHER)
Admission: EM | Admit: 2024-08-17 | Discharge: 2024-08-17 | Disposition: A | Discharge status: Home / Self Care | Attending: Emergency Medicine | Admitting: Emergency Medicine

## 2024-08-17 DIAGNOSIS — Z85828 Personal history of other malignant neoplasm of skin: Secondary | ICD-10-CM | POA: Insufficient documentation

## 2024-08-17 DIAGNOSIS — Z79899 Other long term (current) drug therapy: Secondary | ICD-10-CM | POA: Insufficient documentation

## 2024-08-17 DIAGNOSIS — R110 Nausea: Secondary | ICD-10-CM | POA: Insufficient documentation

## 2024-08-17 DIAGNOSIS — I10 Essential (primary) hypertension: Secondary | ICD-10-CM | POA: Diagnosis not present

## 2024-08-17 DIAGNOSIS — I129 Hypertensive chronic kidney disease with stage 1 through stage 4 chronic kidney disease, or unspecified chronic kidney disease: Secondary | ICD-10-CM | POA: Diagnosis not present

## 2024-08-17 DIAGNOSIS — N189 Chronic kidney disease, unspecified: Secondary | ICD-10-CM | POA: Diagnosis not present

## 2024-08-17 LAB — CBC WITH DIFFERENTIAL/PLATELET
Abs Immature Granulocytes: 0.03 K/uL (ref 0.00–0.07)
Basophils Absolute: 0 K/uL (ref 0.0–0.1)
Basophils Relative: 0 %
Eosinophils Absolute: 0.2 K/uL (ref 0.0–0.5)
Eosinophils Relative: 3 %
HCT: 32.2 % — ABNORMAL LOW (ref 39.0–52.0)
Hemoglobin: 10.7 g/dL — ABNORMAL LOW (ref 13.0–17.0)
Immature Granulocytes: 0 %
Lymphocytes Relative: 14 %
Lymphs Abs: 1.3 K/uL (ref 0.7–4.0)
MCH: 30.4 pg (ref 26.0–34.0)
MCHC: 33.2 g/dL (ref 30.0–36.0)
MCV: 91.5 fL (ref 80.0–100.0)
Monocytes Absolute: 0.3 K/uL (ref 0.1–1.0)
Monocytes Relative: 3 %
Neutro Abs: 7 K/uL (ref 1.7–7.7)
Neutrophils Relative %: 80 %
Platelets: 251 K/uL (ref 150–400)
RBC: 3.52 MIL/uL — ABNORMAL LOW (ref 4.22–5.81)
RDW: 13 % (ref 11.5–15.5)
WBC: 8.9 K/uL (ref 4.0–10.5)
nRBC: 0 % (ref 0.0–0.2)

## 2024-08-17 LAB — TROPONIN T, HIGH SENSITIVITY
Troponin T High Sensitivity: 38 ng/L — ABNORMAL HIGH (ref 0–19)
Troponin T High Sensitivity: 43 ng/L — ABNORMAL HIGH (ref 0–19)

## 2024-08-17 LAB — URINALYSIS, ROUTINE W REFLEX MICROSCOPIC
Bacteria, UA: NONE SEEN
Bilirubin Urine: NEGATIVE
Glucose, UA: NEGATIVE mg/dL
Hgb urine dipstick: NEGATIVE
Ketones, ur: NEGATIVE mg/dL
Leukocytes,Ua: NEGATIVE
Nitrite: NEGATIVE
Protein, ur: NEGATIVE mg/dL
Specific Gravity, Urine: 1.016 (ref 1.005–1.030)
pH: 6 (ref 5.0–8.0)

## 2024-08-17 LAB — COMPREHENSIVE METABOLIC PANEL WITH GFR
ALT: 12 U/L (ref 0–44)
AST: 20 U/L (ref 15–41)
Albumin: 4.1 g/dL (ref 3.5–5.0)
Alkaline Phosphatase: 92 U/L (ref 38–126)
Anion gap: 11 (ref 5–15)
BUN: 20 mg/dL (ref 8–23)
CO2: 26 mmol/L (ref 22–32)
Calcium: 9.4 mg/dL (ref 8.9–10.3)
Chloride: 103 mmol/L (ref 98–111)
Creatinine, Ser: 1.2 mg/dL (ref 0.61–1.24)
GFR, Estimated: >60 mL/min (ref >60)
Glucose, Bld: 84 mg/dL (ref 70–99)
Potassium: 4.2 mmol/L (ref 3.5–5.1)
Sodium: 140 mmol/L (ref 135–145)
Total Bilirubin: 0.4 mg/dL (ref 0.0–1.2)
Total Protein: 6.8 g/dL (ref 6.5–8.1)

## 2024-08-17 LAB — LIPASE, BLOOD: Lipase: 19 U/L (ref 11–51)

## 2024-08-17 MED ORDER — ONDANSETRON HCL 4 MG/2ML IJ SOLN
4.0000 mg | Freq: Once | INTRAMUSCULAR | Status: AC
  Administered 2024-08-17: 4 mg via INTRAVENOUS
  Filled 2024-08-17: qty 2

## 2024-08-17 MED ORDER — FENTANYL CITRATE (PF) 50 MCG/ML IJ SOSY
25.0000 ug | PREFILLED_SYRINGE | Freq: Once | INTRAMUSCULAR | Status: AC
  Administered 2024-08-17: 25 ug via INTRAVENOUS
  Filled 2024-08-17: qty 1

## 2024-08-17 MED ORDER — SODIUM CHLORIDE 0.9 % IV BOLUS
1000.0000 mL | Freq: Once | INTRAVENOUS | Status: AC
  Administered 2024-08-17: 1000 mL via INTRAVENOUS

## 2024-08-17 NOTE — ED Triage Notes (Signed)
 Pt POV reporting nausea x2 hours, took zofran  with no relief, concerned for food poisoning, denies pain.

## 2024-08-17 NOTE — ED Provider Notes (Signed)
 [place] EMERGENCY DEPARTMENT AT [facility] Provider Note   CSN: [id number] Arrival date & time: 08/17/24  0038     Patient presents with: Nausea   Bernard Young is a 83 y.o. male.   The history is provided by the patient and the spouse.   Patient with extensive history including hypertension, GERD, chronic pain, previous CVA, recent abdominal hernia repair  Presents with nausea without vomiting. Patient reports several hours ago he started having nausea but no vomiting, took Zofran  without relief.  Initially thought he might be having food poisoning.  No diarrhea.  No abdominal pain.  No significant constipation.  He reports he has done well postoperatively from his hernia repair No chest pain or shortness of breath.  No headache.  Patient reports he feels very uncomfortable due to the nausea. 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   Chronic kidney disease    Chronic pain    long term opiates   Diverticulosis of colon 05/2017   noted on CT abd/pelvis   Enlarged prostate 06/26/2017   mild , noted on CT abd/pelvis   GERD (gastroesophageal reflux disease)    Headache    History of kidney stones    Hypertension    Inguinal hernia 05/2017   small to moderate right containing bowel loop of sigmoid colon, noted on CT abd/pelvis   Numbness and tingling in left hand    PONV (postoperative nausea and vomiting)    very very bad nausea but no vomiting : i get so sick of it and it is just awful ; even with a full stomach i cant vomit     Renal mass, left    Shoulder pain    right    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 Authorizing Provider  atorvastatin  (LIPITOR) 40 MG tablet Take 1 tablet (40 mg total) by mouth daily.  09/08/21   Krishnan, Gokul, MD  clotrimazole (LOTRIMIN) 1 % cream Apply 1 Application topically daily as needed (Itching).    [provider]  famotidine (PEPCID) 20 MG tablet Take 20 mg by mouth daily as needed (Stomach issue).    [provider]  lactase (LACTAID) 3000 units tablet Take 3,000 Units by mouth daily.    [provider]  losartan  (COZAAR ) 50 MG tablet Take 50 mg by mouth daily. 09/03/17   [provider]  Multiple Vitamins-Minerals (MULTIVITAMIN WITH MINERALS) tablet Take 1 tablet by mouth daily. Centrum +    [provider]  omeprazole (PRILOSEC) 20 MG capsule Take 20 mg by mouth daily.    [provider]  polyethylene glycol powder (GLYCOLAX/MIRALAX) 17 GM/SCOOP powder Take 17 g by mouth daily. Dissolve 1 capful (17g) in 4-8 ounces of liquid and take by mouth daily.    [provider]  triamcinolone  cream (KENALOG ) 0.1 % Apply 1 application topically daily as needed (for psoriasis rash).  05/12/17   [provider]    Allergies: Lactose intolerance (gi), Sulfa antibiotics, and Penicillins    Review of Systems  Constitutional:  Negative for fever.  Cardiovascular:  Negative for chest pain.  Gastrointestinal:  Positive for nausea. Negative  for abdominal pain, constipation, diarrhea and vomiting.  Neurological:  Negative for headaches.    Updated Vital Signs BP 128/61   Pulse 94   Resp 13   SpO2 95%   Physical Exam CONSTITUTIONAL: Elderly, uncomfortable appearing HEAD: Normocephalic/atraumatic EYES: EOMI/PERRL, no icterus NECK: supple no meningeal signs CV: S1/S2 noted, no murmurs/rubs/gallops noted LUNGS: Lungs are clear to auscultation bilaterally, no apparent distress ABDOMEN: soft, nontender, no rebound or guarding, bowel sounds noted throughout abdomen, healing incisions are noted NEURO: Pt is awake/alert/appropriate, moves all extremitiesx4.  No facial droop.   EXTREMITIES: pulses  normal/equal, full ROM SKIN: warm, color normal  (all labs ordered are listed, but only abnormal results are displayed) Labs Reviewed  CBC WITH DIFFERENTIAL/PLATELET - Abnormal; Notable for the following components:      Result Value   RBC 3.52 (*)    Hemoglobin 10.7 (*)    HCT 32.2 (*)    All other components within normal limits  TROPONIN T, HIGH SENSITIVITY - Abnormal; Notable for the following components:   Troponin T High Sensitivity 38 (*)    All other components within normal limits  TROPONIN T, HIGH SENSITIVITY - Abnormal; Notable for the following components:   Troponin T High Sensitivity 43 (*)    All other components within normal limits  COMPREHENSIVE METABOLIC PANEL WITH GFR  LIPASE, BLOOD  URINALYSIS, ROUTINE W REFLEX MICROSCOPIC    EKG: EKG Interpretation Date/Time:  Wednesday August 17 2024 01:20:24 EST Ventricular Rate:  77 PR Interval:  249 QRS Duration:  85 QT Interval:  405 QTC Calculation: 459 R Axis:   98  Text Interpretation: Sinus rhythm Prolonged PR interval Right axis deviation Borderline low voltage, extremity leads No significant change since last tracing Confirmed by Midge Golas ([id number]) on 08/17/2024 1:30:39 AM  Radiology: No results found.   Procedures   Medications Ordered in the ED  ondansetron  (ZOFRAN ) injection 4 mg (4 mg Intravenous Given 08/17/24 0206)  sodium chloride  0.9 % bolus 1,000 mL (0 mLs Intravenous Stopped 08/17/24 0344)  fentaNYL  (SUBLIMAZE ) injection 25 mcg (25 mcg Intravenous Given 08/17/24 0242)  ondansetron  (ZOFRAN ) injection 4 mg (4 mg Intravenous Given 08/17/24 0439)    Clinical Course as of 08/17/24 0523  Wed Aug 17, 2024  0240 Patient presents because he feels nausea but no other symptoms except for his chronic pain for which he is requesting pain medicine He denies any headache, chest pain or abdominal pain. Will check labs, treat nausea and reassess [DW]  0303 Overall patient reports feeling improved.   Will continue to monitor [DW]  [phone] Patient has been monitored for several hours.  He is overall feeling improved [DW]  0521 Patient denies any chest pain, back pain or abdominal pain.  He has chronic joint pain that is unchanged.  He has had no vomiting  Patient does take Vicodin on a daily basis.  It is possible this is contributing to his nausea. [DW]  0522 He appears to be healing well from his recent hernia repair.  He has no focal abdominal tenderness or distention to suggest any acute abdominal emergency. [DW]  0522 For screening purposes, troponins were initially sent, and troponin is mildly elevated though flat. [DW]  0522 He never had chest pain or shortness of breath, no acute EKG changes, my suspicion for ACS is low   Will defer further workup for now.  Patient reports feeling improved, taking p.o. fluids and requesting discharge  We discussed return precautions [DW]    Clinical  Course User Index [DW] Midge Golas, MD                                 Medical Decision Making Amount and/or Complexity of Data Reviewed Labs: ordered. ECG/medicine tests: ordered.  Risk Prescription drug management.   This patient presents to the ED for concern of nausea, this involves an extensive number of treatment options, and is a complaint that carries with it a high risk of complications and morbidity.  The differential diagnosis includes but is not limited to bowel obstruction, medication reaction, acute coronary syndrome, urinary tract infection, electrolyte disturbance  Comorbidities that complicate the patient evaluation: Patient's presentation is complicated by their history of hypertension  Social Determinants of Health: Patient's chronic pain  increases the complexity of managing their presentation  Additional history obtained: Additional history obtained from spouse Records reviewed operative notes reviewed  Lab Tests: I Ordered, and personally interpreted labs.  The  pertinent results include: Mild anemia, mildly elevated troponin  Cardiac Monitoring: The patient was maintained on a cardiac monitor.  I personally viewed and interpreted the cardiac monitor which showed an underlying rhythm of:  sinus rhythm  Medicines ordered and prescription drug management: I ordered medication including Zofran  for nausea Reevaluation of the patient after these medicines showed that the patient    improved  Test Considered: No indication for CT abdomen pelvis as patient is without any abdominal pain, no abdominal distention or focal tenderness  Reevaluation: After the interventions noted above, I reevaluated the patient and found that they have :improved  Complexity of problems addressed: Patient's presentation is most consistent with  acute presentation with potential threat to life or bodily function  Disposition: After consideration of the diagnostic results and the patient's response to treatment,  I feel that the patent would benefit from discharge  .        Final diagnoses:  Nausea    ED Discharge Orders     None          Midge Golas, MD 08/17/24 [phone]

## 2024-08-17 NOTE — Discharge Instructions (Signed)
   SEEK IMMEDIATE MEDICAL ATTENTION IF: The pain does not go away or becomes severe, particularly over the next 8-12 hours.  A temperature above 100.2F develops.  Repeated vomiting occurs (multiple episodes).   Blood is being passed in stools or vomit (bright red or black tarry stools).  Return also if you develop chest pain, difficulty breathing, dizziness or fainting, or become confused, poorly responsive, or inconsolable.
